# Patient Record
Sex: Female | Born: 1995
Health system: Southern US, Community
[De-identification: ages and names within clinical notes are randomized; demographics above are authoritative.]

## PROBLEM LIST (undated history)

## (undated) DIAGNOSIS — F329 Major depressive disorder, single episode, unspecified: Secondary | ICD-10-CM

## (undated) DIAGNOSIS — F32A Depression, unspecified: Secondary | ICD-10-CM

## (undated) HISTORY — DX: Depression, unspecified: F32.A

## (undated) HISTORY — DX: Major depressive disorder, single episode, unspecified: F32.9

## (undated) HISTORY — PX: NO PAST SURGERIES: SHX2092

---

## 2013-06-30 ENCOUNTER — Ambulatory Visit (INDEPENDENT_AMBULATORY_CARE_PROVIDER_SITE_OTHER): Payer: BC Managed Care – PPO | Admitting: Family

## 2013-06-30 ENCOUNTER — Encounter: Payer: Self-pay | Admitting: Family

## 2013-06-30 VITALS — BP 109/61 | Wt 147.0 lb

## 2013-06-30 DIAGNOSIS — Z3400 Encounter for supervision of normal first pregnancy, unspecified trimester: Secondary | ICD-10-CM

## 2013-06-30 DIAGNOSIS — Z3491 Encounter for supervision of normal pregnancy, unspecified, first trimester: Secondary | ICD-10-CM

## 2013-06-30 LAB — HIV ANTIBODY (ROUTINE TESTING W REFLEX): HIV: NONREACTIVE

## 2013-06-30 NOTE — Progress Notes (Signed)
P - [phone] 

## 2013-06-30 NOTE — Progress Notes (Signed)
New OB visit; reviewed practice information and OB visit schedule.  Uncertain LMP, will return in one week for ultrasound with Lora.  Obtain new ob labs today.  +wet prep due to positive odor.   Exam   BP 109/61  Wt 147 lb (66.679 kg)  LMP 05/06/2013 Uterine Size: 5-6 weeks  Pelvic Exam:    Perineum: No Hemorrhoids, Normal Perineum   Vulva: normal   Vagina:  normal mucosa, +white adherent discharge, +odor, "fishy"   pH: Not done   Cervix:  no cervical motion tenderness and no lesions   Adnexa: normal adnexa and no mass, fullness, tenderness   Bony Pelvis: Adequate  System: Breast:  No nipple retraction or dimpling, inverted nipples bilat; No nipple discharge or bleeding, No axillary or supraclavicular adenopathy, Normal to palpation without dominant masses   Skin: normal coloration and turgor, no rashes    Neurologic: negative   Extremities: normal strength, tone, and muscle mass   HEENT neck supple with midline trachea and thyroid without masses   Mouth/Teeth mucous membranes moist, pharynx normal without lesions   Neck supple and no masses   Cardiovascular: regular rate and rhythm, no murmurs or gallops   Respiratory:  appears well, vitals normal, no respiratory distress, acyanotic, normal RR, neck free of mass or lymphadenopathy, chest clear, no wheezing, crepitations, rhonchi, normal symmetric air entry   Abdomen: soft, non-tender; bowel sounds normal; no masses,  no organomegaly   Urinary: urethral meatus normal

## 2013-07-01 LAB — WET PREP FOR TRICH, YEAST, CLUE

## 2013-07-01 LAB — CYSTIC FIBROSIS DIAGNOSTIC STUDY

## 2013-07-01 LAB — GC/CHLAMYDIA PROBE AMP: CT Probe RNA: NEGATIVE

## 2013-07-02 ENCOUNTER — Other Ambulatory Visit: Payer: Self-pay | Admitting: Family

## 2013-07-02 LAB — OBSTETRIC PANEL
Antibody Screen: NEGATIVE
Basophils Relative: 0 % (ref 0–1)
Eosinophils Relative: 0 % (ref 0–5)
HCT: 36.7 % (ref 36.0–46.0)
Hemoglobin: 12.3 g/dL (ref 12.0–15.0)
MCHC: 33.5 g/dL (ref 30.0–36.0)
MCV: 78.9 fL (ref 78.0–100.0)
Monocytes Absolute: 0.7 10*3/uL (ref 0.1–1.0)
Monocytes Relative: 10 % (ref 3–12)
Neutro Abs: 5.2 10*3/uL (ref 1.7–7.7)
RDW: 14.6 % (ref 11.5–15.5)
Rh Type: POSITIVE
Rubella: 1.02 Index — ABNORMAL HIGH (ref <0.90)

## 2013-07-02 LAB — CULTURE, OB URINE: Organism ID, Bacteria: NO GROWTH

## 2013-07-02 MED ORDER — METRONIDAZOLE 500 MG PO TABS: 500.0000 mg | ORAL_TABLET | Freq: Two times a day (BID) | ORAL | Status: DC

## 2013-07-02 NOTE — Progress Notes (Signed)
Pt notified regarding +clue cells; will pick up RX from pharmacy.

## 2013-07-07 ENCOUNTER — Ambulatory Visit (INDEPENDENT_AMBULATORY_CARE_PROVIDER_SITE_OTHER): Payer: BC Managed Care – PPO | Admitting: Advanced Practice Midwife

## 2013-07-07 VITALS — Ht 65.0 in | Wt 147.0 lb

## 2013-07-07 DIAGNOSIS — Z3480 Encounter for supervision of other normal pregnancy, unspecified trimester: Secondary | ICD-10-CM

## 2013-07-07 DIAGNOSIS — Z3490 Encounter for supervision of normal pregnancy, unspecified, unspecified trimester: Secondary | ICD-10-CM

## 2013-07-07 NOTE — Progress Notes (Signed)
U/S only today  IUP with CRL of 21.21mm and GA of [redacted]w[redacted]d  FHT is 180 BPM and a 29x34 mm possible fibroid noted.  Pt to return in 3 weeks for ROB and discuss whether she wants 1st Trimester screen

## 2013-07-28 ENCOUNTER — Encounter: Payer: Self-pay | Admitting: Family

## 2013-07-28 ENCOUNTER — Ambulatory Visit (INDEPENDENT_AMBULATORY_CARE_PROVIDER_SITE_OTHER): Payer: BC Managed Care – PPO | Admitting: Family

## 2013-07-28 VITALS — BP 119/74 | Temp 97.8°F | Wt 149.0 lb

## 2013-07-28 DIAGNOSIS — Z3490 Encounter for supervision of normal pregnancy, unspecified, unspecified trimester: Secondary | ICD-10-CM

## 2013-07-28 DIAGNOSIS — Z3400 Encounter for supervision of normal first pregnancy, unspecified trimester: Secondary | ICD-10-CM

## 2013-07-28 MED ORDER — ONDANSETRON 8 MG PO TBDP: 8.0000 mg | ORAL_TABLET | Freq: Three times a day (TID) | ORAL | Status: DC | PRN

## 2013-07-28 NOTE — Progress Notes (Signed)
P-117 

## 2013-07-28 NOTE — Progress Notes (Signed)
Ketones trace.  Urine was clear

## 2013-07-28 NOTE — Progress Notes (Signed)
Reports increased nausea/vomiting; denies fever, body aches, or chills. RX Zofran sent to pharmacy.  Trace ketones, clear urine.  Reviewed genetic testing, will do first screen.

## 2013-08-04 ENCOUNTER — Other Ambulatory Visit: Payer: Self-pay

## 2013-08-04 ENCOUNTER — Ambulatory Visit (HOSPITAL_COMMUNITY)
Admission: RE | Admit: 2013-08-04 | Discharge: 2013-08-04 | Disposition: A | Discharge status: Home / Self Care | Payer: BC Managed Care – PPO | Source: Ambulatory Visit | Attending: Family | Admitting: Family

## 2013-08-04 DIAGNOSIS — O3510X0 Maternal care for (suspected) chromosomal abnormality in fetus, unspecified, not applicable or unspecified: Secondary | ICD-10-CM | POA: Insufficient documentation

## 2013-08-04 DIAGNOSIS — O351XX0 Maternal care for (suspected) chromosomal abnormality in fetus, not applicable or unspecified: Secondary | ICD-10-CM | POA: Insufficient documentation

## 2013-08-04 DIAGNOSIS — Z3689 Encounter for other specified antenatal screening: Secondary | ICD-10-CM | POA: Insufficient documentation

## 2013-08-04 DIAGNOSIS — Z3490 Encounter for supervision of normal pregnancy, unspecified, unspecified trimester: Secondary | ICD-10-CM

## 2013-08-10 ENCOUNTER — Encounter: Payer: Self-pay | Admitting: Family

## 2013-08-26 ENCOUNTER — Ambulatory Visit (INDEPENDENT_AMBULATORY_CARE_PROVIDER_SITE_OTHER): Payer: BC Managed Care – PPO | Admitting: Obstetrics & Gynecology

## 2013-08-26 VITALS — BP 121/73 | Wt 154.0 lb

## 2013-08-26 DIAGNOSIS — Z3400 Encounter for supervision of normal first pregnancy, unspecified trimester: Secondary | ICD-10-CM

## 2013-08-26 DIAGNOSIS — Z3402 Encounter for supervision of normal first pregnancy, second trimester: Secondary | ICD-10-CM

## 2013-08-26 NOTE — Progress Notes (Signed)
Pt offered AFP.  Pt not decided today.  Test is ordered and pt knows that the last date the test can be drawn is at 20 weeks 6 days.  Anatomy ordered for 19 weeks.   Pt having some nausea but getting better.  5 lb weight gain in past month.

## 2013-08-26 NOTE — Progress Notes (Signed)
p-99 

## 2013-08-28 ENCOUNTER — Other Ambulatory Visit: Payer: Self-pay

## 2013-09-16 ENCOUNTER — Other Ambulatory Visit (HOSPITAL_COMMUNITY): Payer: Self-pay | Admitting: Obstetrics & Gynecology

## 2013-09-16 ENCOUNTER — Other Ambulatory Visit: Payer: Self-pay | Admitting: Obstetrics & Gynecology

## 2013-09-16 ENCOUNTER — Ambulatory Visit (HOSPITAL_COMMUNITY): Admission: RE | Admit: 2013-09-16 | Payer: BC Managed Care – PPO | Source: Ambulatory Visit

## 2013-09-16 ENCOUNTER — Ambulatory Visit (HOSPITAL_COMMUNITY)
Admission: RE | Admit: 2013-09-16 | Discharge: 2013-09-16 | Disposition: A | Discharge status: Home / Self Care | Payer: BC Managed Care – PPO | Source: Ambulatory Visit | Attending: Obstetrics & Gynecology | Admitting: Obstetrics & Gynecology

## 2013-09-16 DIAGNOSIS — Z3402 Encounter for supervision of normal first pregnancy, second trimester: Secondary | ICD-10-CM

## 2013-09-16 DIAGNOSIS — Z3689 Encounter for other specified antenatal screening: Secondary | ICD-10-CM | POA: Insufficient documentation

## 2013-09-23 ENCOUNTER — Encounter: Payer: BC Managed Care – PPO | Admitting: Obstetrics & Gynecology

## 2013-10-06 ENCOUNTER — Ambulatory Visit (INDEPENDENT_AMBULATORY_CARE_PROVIDER_SITE_OTHER): Payer: BC Managed Care – PPO | Admitting: Advanced Practice Midwife

## 2013-10-06 ENCOUNTER — Encounter: Payer: Self-pay | Admitting: Advanced Practice Midwife

## 2013-10-06 VITALS — BP 131/69 | Wt 161.0 lb

## 2013-10-06 DIAGNOSIS — IMO0002 Reserved for concepts with insufficient information to code with codable children: Secondary | ICD-10-CM

## 2013-10-06 DIAGNOSIS — Z3402 Encounter for supervision of normal first pregnancy, second trimester: Secondary | ICD-10-CM

## 2013-10-06 DIAGNOSIS — Z3400 Encounter for supervision of normal first pregnancy, unspecified trimester: Secondary | ICD-10-CM

## 2013-10-06 NOTE — Patient Instructions (Signed)
Second Trimester of Pregnancy The second trimester is from week 13 through week 28, months 4 through 6. The second trimester is often a time when you feel your best. Your body has also adjusted to being pregnant, and you begin to feel better physically. Usually, morning sickness has lessened or quit completely, you may have more energy, and you may have an increase in appetite. The second trimester is also a time when the fetus is growing rapidly. At the end of the sixth month, the fetus is about 9 inches long and weighs about 1 pounds. You will likely begin to feel the baby move (quickening) between 18 and 20 weeks of the pregnancy. BODY CHANGES Your body goes through many changes during pregnancy. The changes vary from woman to woman.   Your weight will continue to increase. You will notice your lower abdomen bulging out.  You may begin to get stretch marks on your hips, abdomen, and breasts.  You may develop headaches that can be relieved by medicines approved by your caregiver.  You may urinate more often because the fetus is pressing on your bladder.  You may develop or continue to have heartburn as a result of your pregnancy.  You may develop constipation because certain hormones are causing the muscles that push waste through your intestines to slow down.  You may develop hemorrhoids or swollen, bulging veins (varicose veins).  You may have back pain because of the weight gain and pregnancy hormones relaxing your joints between the bones in your pelvis and as a result of a shift in weight and the muscles that support your balance.  Your breasts will continue to grow and be tender.  Your gums may bleed and may be sensitive to brushing and flossing.  Dark spots or blotches (chloasma, mask of pregnancy) may develop on your face. This will likely fade after the baby is born.  A dark line from your belly button to the pubic area (linea nigra) may appear. This will likely fade after the  baby is born. WHAT TO EXPECT AT YOUR PRENATAL VISITS During a routine prenatal visit:  You will be weighed to make sure you and the fetus are growing normally.  Your blood pressure will be taken.  Your abdomen will be measured to track your baby's growth.  The fetal heartbeat will be listened to.  Any test results from the previous visit will be discussed. Your caregiver may ask you:  How you are feeling.  If you are feeling the baby move.  If you have had any abnormal symptoms, such as leaking fluid, bleeding, severe headaches, or abdominal cramping.  If you have any questions. Other tests that may be performed during your second trimester include:  Blood tests that check for:  Low iron levels (anemia).  Gestational diabetes (between 24 and 28 weeks).  Rh antibodies.  Urine tests to check for infections, diabetes, or protein in the urine.  An ultrasound to confirm the proper growth and development of the baby.  An amniocentesis to check for possible genetic problems.  Fetal screens for spina bifida and Down syndrome. HOME CARE INSTRUCTIONS   Avoid all smoking, herbs, alcohol, and unprescribed drugs. These chemicals affect the formation and growth of the baby.  Follow your caregiver's instructions regarding medicine use. There are medicines that are either safe or unsafe to take during pregnancy.  Exercise only as directed by your caregiver. Experiencing uterine cramps is a good sign to stop exercising.  Continue to eat regular,   healthy meals.  Wear a good support bra for breast tenderness.  Do not use hot tubs, steam rooms, or saunas.  Wear your seat belt at all times when driving.  Avoid raw meat, uncooked cheese, cat litter boxes, and soil used by cats. These carry germs that can cause birth defects in the baby.  Take your prenatal vitamins.  Try taking a stool softener (if your caregiver approves) if you develop constipation. Eat more high-fiber foods,  such as fresh vegetables or fruit and whole grains. Drink plenty of fluids to keep your urine clear or pale yellow.  Take warm sitz baths to soothe any pain or discomfort caused by hemorrhoids. Use hemorrhoid cream if your caregiver approves.  If you develop varicose veins, wear support hose. Elevate your feet for 15 minutes, 3 4 times a day. Limit salt in your diet.  Avoid heavy lifting, wear low heel shoes, and practice good posture.  Rest with your legs elevated if you have leg cramps or low back pain.  Visit your dentist if you have not gone yet during your pregnancy. Use a soft toothbrush to brush your teeth and be gentle when you floss.  A sexual relationship may be continued unless your caregiver directs you otherwise.  Continue to go to all your prenatal visits as directed by your caregiver. SEEK MEDICAL CARE IF:   You have dizziness.  You have mild pelvic cramps, pelvic pressure, or nagging pain in the abdominal area.  You have persistent nausea, vomiting, or diarrhea.  You have a bad smelling vaginal discharge.  You have pain with urination. SEEK IMMEDIATE MEDICAL CARE IF:   You have a fever.  You are leaking fluid from your vagina.  You have spotting or bleeding from your vagina.  You have severe abdominal cramping or pain.  You have rapid weight gain or loss.  You have shortness of breath with chest pain.  You notice sudden or extreme swelling of your face, hands, ankles, feet, or legs.  You have not felt your baby move in over an hour.  You have severe headaches that do not go away with medicine.  You have vision changes. Document Released: 10/03/2001 Document Revised: 06/11/2013 Document Reviewed: 12/10/2012 [employer] Patient Information 2014 [employer].  

## 2013-10-06 NOTE — Progress Notes (Signed)
[place] reviewed.  Placenta posterior/fundal.  Marginal cord insertion. Cervix 2.7cm long.  Will consult Dr Sherrie George about these findings (she signed [place]).  Feels well Denies UCs or bleeding

## 2013-10-06 NOTE — Progress Notes (Signed)
p-93  Quad screen declined

## 2013-10-23 NOTE — L&D Delivery Note (Signed)
Operative Delivery Note At 8:09 AM a viable female was delivered via Vaginal, Vacuum Investment banker, operational(Extractor).  Presentation: vertex; Position: Left,, Occiput,, Anterior; Station: +4.  Verbal consent: obtained from patient.  Risks and benefits discussed in detail.  Risks include, but are not limited to the risks of anesthesia, bleeding, infection, damage to maternal tissues, fetal cephalhematoma.  There is also the risk of inability to effect vaginal delivery of the head, or shoulder dystocia that cannot be resolved by established maneuvers, leading to the need for emergency cesarean section. FHR decels had been going on all evening and had progressively gotten worse with some hyperstimulation prior to my arrival.  VE showed pt. To be complete and + 4.  Pushed x 1 with little movement to affect delivery with FHR in the 70's x 8 minutes.  Kiwi applied and pulled with one contraction with easy descent and delivery.  Cord clamped x 2 and taken to waiting peds. Cord blood and pH obtained.  Placenta delivered spontaneously and intact with 3 VC.  Mild atony resolved with removal of clot from LUS.  800 mcg of Cytotec and IV Pitocin given.  APGAR: 4, 9; weight .   Placenta status: Intact, Spontaneous.   Cord: 3 vessels with the following complications: .  Cord pH: 7.13  Anesthesia: Epidural  Instruments: Kiwi vacuum Episiotomy: None Lacerations: Small labial  Est. Blood Loss (mL): 500  Mom to postpartum.  Baby to Couplet care / Skin to Skin.  Reva Boresanya S Brenan [place] 02/11/2014, 9:18 AM

## 2013-11-03 ENCOUNTER — Ambulatory Visit (INDEPENDENT_AMBULATORY_CARE_PROVIDER_SITE_OTHER): Payer: BC Managed Care – PPO | Admitting: Advanced Practice Midwife

## 2013-11-03 ENCOUNTER — Encounter: Payer: Self-pay | Admitting: Advanced Practice Midwife

## 2013-11-03 VITALS — BP 122/67 | Wt 164.0 lb

## 2013-11-03 DIAGNOSIS — Z3402 Encounter for supervision of normal first pregnancy, second trimester: Secondary | ICD-10-CM

## 2013-11-03 DIAGNOSIS — Z3400 Encounter for supervision of normal first pregnancy, unspecified trimester: Secondary | ICD-10-CM

## 2013-11-03 NOTE — Progress Notes (Signed)
No growth US recommended by Dr. Sherrie Georgeecker for marginal cord insertion. Watch fundal height. Undecided about flu vaccine. R/B discussed at length.

## 2013-11-03 NOTE — Patient Instructions (Signed)
Second Trimester of Pregnancy The second trimester is from week 13 through week 28, months 4 through 6. The second trimester is often a time when you feel your best. Your body has also adjusted to being pregnant, and you begin to feel better physically. Usually, morning sickness has lessened or quit completely, you may have more energy, and you may have an increase in appetite. The second trimester is also a time when the fetus is growing rapidly. At the end of the sixth month, the fetus is about 9 inches long and weighs about 1 pounds. You will likely begin to feel the baby move (quickening) between 18 and 20 weeks of the pregnancy. BODY CHANGES Your body goes through many changes during pregnancy. The changes vary from woman to woman.   Your weight will continue to increase. You will notice your lower abdomen bulging out.  You may begin to get stretch marks on your hips, abdomen, and breasts.  You may develop headaches that can be relieved by medicines approved by your caregiver.  You may urinate more often because the fetus is pressing on your bladder.  You may develop or continue to have heartburn as a result of your pregnancy.  You may develop constipation because certain hormones are causing the muscles that push waste through your intestines to slow down.  You may develop hemorrhoids or swollen, bulging veins (varicose veins).  You may have back pain because of the weight gain and pregnancy hormones relaxing your joints between the bones in your pelvis and as a result of a shift in weight and the muscles that support your balance.  Your breasts will continue to grow and be tender.  Your gums may bleed and may be sensitive to brushing and flossing.  Dark spots or blotches (chloasma, mask of pregnancy) may develop on your face. This will likely fade after the baby is born.  A dark line from your belly button to the pubic area (linea nigra) may appear. This will likely fade after the  baby is born. WHAT TO EXPECT AT YOUR PRENATAL VISITS During a routine prenatal visit:  You will be weighed to make sure you and the fetus are growing normally.  Your blood pressure will be taken.  Your abdomen will be measured to track your baby's growth.  The fetal heartbeat will be listened to.  Any test results from the previous visit will be discussed. Your caregiver may ask you:  How you are feeling.  If you are feeling the baby move.  If you have had any abnormal symptoms, such as leaking fluid, bleeding, severe headaches, or abdominal cramping.  If you have any questions. Other tests that may be performed during your second trimester include:  Blood tests that check for:  Low iron levels (anemia).  Gestational diabetes (between 24 and 28 weeks).  Rh antibodies.  Urine tests to check for infections, diabetes, or protein in the urine.  An ultrasound to confirm the proper growth and development of the baby.  An amniocentesis to check for possible genetic problems.  Fetal screens for spina bifida and Down syndrome. HOME CARE INSTRUCTIONS   Avoid all smoking, herbs, alcohol, and unprescribed drugs. These chemicals affect the formation and growth of the baby.  Follow your caregiver's instructions regarding medicine use. There are medicines that are either safe or unsafe to take during pregnancy.  Exercise only as directed by your caregiver. Experiencing uterine cramps is a good sign to stop exercising.  Continue to eat regular,   healthy meals.  Wear a good support bra for breast tenderness.  Do not use hot tubs, steam rooms, or saunas.  Wear your seat belt at all times when driving.  Avoid raw meat, uncooked cheese, cat litter boxes, and soil used by cats. These carry germs that can cause birth defects in the baby.  Take your prenatal vitamins.  Try taking a stool softener (if your caregiver approves) if you develop constipation. Eat more high-fiber foods,  such as fresh vegetables or fruit and whole grains. Drink plenty of fluids to keep your urine clear or pale yellow.  Take warm sitz baths to soothe any pain or discomfort caused by hemorrhoids. Use hemorrhoid cream if your caregiver approves.  If you develop varicose veins, wear support hose. Elevate your feet for 15 minutes, 3 4 times a day. Limit salt in your diet.  Avoid heavy lifting, wear low heel shoes, and practice good posture.  Rest with your legs elevated if you have leg cramps or low back pain.  Visit your dentist if you have not gone yet during your pregnancy. Use a soft toothbrush to brush your teeth and be gentle when you floss.  A sexual relationship may be continued unless your caregiver directs you otherwise.  Continue to go to all your prenatal visits as directed by your caregiver. SEEK MEDICAL CARE IF:   You have dizziness.  You have mild pelvic cramps, pelvic pressure, or nagging pain in the abdominal area.  You have persistent nausea, vomiting, or diarrhea.  You have a bad smelling vaginal discharge.  You have pain with urination. SEEK IMMEDIATE MEDICAL CARE IF:  Breastfeeding Deciding to breastfeed is one of the best choices you can make for you and your baby. A change in hormones during pregnancy causes your breast tissue to grow and increases the number and size of your milk ducts. These hormones also allow proteins, sugars, and fats from your blood supply to make breast milk in your milk-producing glands. Hormones prevent breast milk from being released before your baby is born as well as prompt milk flow after birth. Once breastfeeding has begun, thoughts of your baby, as well as his or her sucking or crying, can stimulate the release of milk from your milk-producing glands.  BENEFITS OF BREASTFEEDING For Your Baby  Your first milk (colostrum) helps your baby's digestive system function better.   There are antibodies in your milk that help your baby fight  off infections.   Your baby has a lower incidence of asthma, allergies, and sudden infant death syndrome.   The nutrients in breast milk are better for your baby than infant formulas and are designed uniquely for your baby's needs.   Breast milk improves your baby's brain development.   Your baby is less likely to develop other conditions, such as childhood obesity, asthma, or type 2 diabetes mellitus.  For You   Breastfeeding helps to create a very special bond between you and your baby.   Breastfeeding is convenient. Breast milk is always available at the correct temperature and costs nothing.   Breastfeeding helps to burn calories and helps you lose the weight gained during pregnancy.   Breastfeeding makes your uterus contract to its prepregnancy size faster and slows bleeding (lochia) after you give birth.   Breastfeeding helps to lower your risk of developing type 2 diabetes mellitus, osteoporosis, and breast or ovarian cancer later in life. SIGNS THAT YOUR BABY IS HUNGRY Early Signs of Hunger  Increased alertness or activity.  Stretching.  Movement of the head from side to side.  Movement of the head and opening of the mouth when the corner of the mouth or cheek is stroked (rooting).  Increased sucking sounds, smacking lips, cooing, sighing, or squeaking.  Hand-to-mouth movements.  Increased sucking of fingers or hands. Late Signs of Hunger  Fussing.  Intermittent crying. Extreme Signs of Hunger Signs of extreme hunger will require calming and consoling before your baby will be able to breastfeed successfully. Do not wait for the following signs of extreme hunger to occur before you initiate breastfeeding:   Restlessness.  A loud, strong cry.   Screaming. BREASTFEEDING BASICS Breastfeeding Initiation  Find a comfortable place to sit or lie down, with your neck and back well supported.  Place a pillow or rolled up blanket under your baby to bring  him or her to the level of your breast (if you are seated). Nursing pillows are specially designed to help support your arms and your baby while you breastfeed.  Make sure that your baby's abdomen is facing your abdomen.   Gently massage your breast. With your fingertips, massage from your chest wall toward your nipple in a circular motion. This encourages milk flow. You may need to continue this action during the feeding if your milk flows slowly.  Support your breast with 4 fingers underneath and your thumb above your nipple. Make sure your fingers are well away from your nipple and your baby's mouth.   Stroke your baby's lips gently with your finger or nipple.   When your baby's mouth is open wide enough, quickly bring your baby to your breast, placing your entire nipple and as much of the colored area around your nipple (areola) as possible into your baby's mouth.   More areola should be visible above your baby's upper lip than below the lower lip.   Your baby's tongue should be between his or her lower gum and your breast.   Ensure that your baby's mouth is correctly positioned around your nipple (latched). Your baby's lips should create a seal on your breast and be turned out (everted).  It is common for your baby to suck about 2 3 minutes in order to start the flow of breast milk. Latching Teaching your baby how to latch on to your breast properly is very important. An improper latch can cause nipple pain and decreased milk supply for you and poor weight gain in your baby. Also, if your baby is not latched onto your nipple properly, he or she may swallow some air during feeding. This can make your baby fussy. Burping your baby when you switch breasts during the feeding can help to get rid of the air. However, teaching your baby to latch on properly is still the best way to prevent fussiness from swallowing air while breastfeeding. Signs that your baby has successfully latched on to  your nipple:    Silent tugging or silent sucking, without causing you pain.   Swallowing heard between every 3 4 sucks.    Muscle movement above and in front of his or her ears while sucking.  Signs that your baby has not successfully latched on to nipple:   Sucking sounds or smacking sounds from your baby while breastfeeding.  Nipple pain. If you think your baby has not latched on correctly, slip your finger into the corner of your baby's mouth to break the suction and place it between your baby's gums. Attempt breastfeeding initiation again. Signs of Successful  Breastfeeding Signs from your baby:   A gradual decrease in the number of sucks or complete cessation of sucking.   Falling asleep.   Relaxation of his or her body.   Retention of a small amount of milk in his or her mouth.   Letting go of your breast by himself or herself. Signs from you:  Breasts that have increased in firmness, weight, and size 1 3 hours after feeding.   Breasts that are softer immediately after breastfeeding.  Increased milk volume, as well as a change in milk consistency and color by the 5th day of breastfeeding.   Nipples that are not sore, cracked, or bleeding. Signs That Your Pecola LeisureBaby is Getting Enough Milk  Wetting at least 3 diapers in a 24-hour period. The urine should be clear and pale yellow by age 675 days.  At least 3 stools in a 24-hour period by age 675 days. The stool should be soft and yellow.  At least 3 stools in a 24-hour period by age 67 days. The stool should be seedy and yellow.  No loss of weight greater than 10% of birth weight during the first 123 days of age.  Average weight gain of 4 7 ounces (120 210 mL) per week after age 74 days.  Consistent daily weight gain by age 675 days, without weight loss after the age of 2 weeks. After a feeding, your baby may spit up a small amount. This is common. BREASTFEEDING FREQUENCY AND DURATION Frequent feeding will help you make  more milk and can prevent sore nipples and breast engorgement. Breastfeed when you feel the need to reduce the fullness of your breasts or when your baby shows signs of hunger. This is called "breastfeeding on demand." Avoid introducing a pacifier to your baby while you are working to establish breastfeeding (the first 4 6 weeks after your baby is born). After this time you may choose to use a pacifier. Research has shown that pacifier use during the first year of a baby's life decreases the risk of sudden infant death syndrome (SIDS). Allow your baby to feed on each breast as long as he or she wants. Breastfeed until your baby is finished feeding. When your baby unlatches or falls asleep while feeding from the first breast, offer the second breast. Because newborns are often sleepy in the first few weeks of life, you may need to awaken your baby to get him or her to feed. Breastfeeding times will vary from baby to baby. However, the following rules can serve as a guide to help you ensure that your baby is properly fed:  Newborns (babies 674 weeks of age or younger) may breastfeed every 1 3 hours.  Newborns should not go longer than 3 hours during the day or 5 hours during the night without breastfeeding.  You should breastfeed your baby a minimum of 8 times in a 24-hour period until you begin to introduce solid foods to your baby at around 326 months of age. BREAST MILK PUMPING Pumping and storing breast milk allows you to ensure that your baby is exclusively fed your breast milk, even at times when you are unable to breastfeed. This is especially important if you are going back to work while you are still breastfeeding or when you are not able to be present during feedings. Your lactation consultant can give you guidelines on how long it is safe to store breast milk.  A breast pump is a machine that allows you to pump  milk from your breast into a sterile bottle. The pumped breast milk can then be stored in  a refrigerator or freezer. Some breast pumps are operated by hand, while others use electricity. Ask your lactation consultant which type will work best for you. Breast pumps can be purchased, but some hospitals and breastfeeding support groups lease breast pumps on a monthly basis. A lactation consultant can teach you how to hand express breast milk, if you prefer not to use a pump.  CARING FOR YOUR BREASTS WHILE YOU BREASTFEED Nipples can become dry, cracked, and sore while breastfeeding. The following recommendations can help keep your breasts moisturized and healthy:  Avoid using soap on your nipples.   Wear a supportive bra. Although not required, special nursing bras and tank tops are designed to allow access to your breasts for breastfeeding without taking off your entire bra or top. Avoid wearing underwire style bras or extremely tight bras.  Air dry your nipples for 3 after each feeding.   Use only cotton bra pads to absorb leaked breast milk. Leaking of breast milk between feedings is normal.   Use lanolin on your nipples after breastfeeding. Lanolin helps to maintain your skin's normal moisture barrier. If you use pure lanolin you do not need to wash it off before feeding your baby again. Pure lanolin is not toxic to your baby. You may also hand express a few drops of breast milk and gently massage that milk into your nipples and allow the milk to air dry. In the first few weeks after giving birth, some women experience extremely full breasts (engorgement). Engorgement can make your breasts feel heavy, warm, and tender to the touch. Engorgement peaks within 3 5 days after you give birth. The following recommendations can help ease engorgement:  Completely empty your breasts while breastfeeding or pumping. You may want to start by applying warm, moist heat (in the shower or with warm water-soaked hand towels) just before feeding or pumping. This increases circulation and helps  the milk flow. If your baby does not completely empty your breasts while breastfeeding, pump any extra milk after he or she is finished.  Wear a snug bra (nursing or regular) or tank top for 1 2 days to signal your body to slightly decrease milk production.  Apply ice packs to your breasts, unless this is too uncomfortable for you.  Make sure that your baby is latched on and positioned properly while breastfeeding. If engorgement persists after 48 hours of following these recommendations, contact your health care provider or a Advertising copywriter. OVERALL HEALTH CARE RECOMMENDATIONS WHILE BREASTFEEDING  Eat healthy foods. Alternate between meals and snacks, eating 3 of each per day. Because what you eat affects your breast milk, some of the foods may make your baby more irritable than usual. Avoid eating these foods if you are sure that they are negatively affecting your baby.  Drink milk, fruit juice, and water to satisfy your thirst (about 10 glasses a day).   Rest often, relax, and continue to take your prenatal vitamins to prevent fatigue, stress, and anemia.  Continue breast self-awareness checks.  Avoid chewing and smoking tobacco.  Avoid alcohol and drug use. Some medicines that may be harmful to your baby can pass through breast milk. It is important to ask your health care provider before taking any medicine, including all over-the-counter and prescription medicine as well as vitamin and herbal supplements. It is possible to become pregnant while breastfeeding. If birth control is desired,  ask your health care provider about options that will be safe for your baby. SEEK MEDICAL CARE IF:   You feel like you want to stop breastfeeding or have become frustrated with breastfeeding.  You have painful breasts or nipples.  Your nipples are cracked or bleeding.  Your breasts are red, tender, or warm.  You have a swollen area on either breast.  You have a fever or chills.  You  have nausea or vomiting.  You have drainage other than breast milk from your nipples.  Your breasts do not become full before feedings by the 5th day after you give birth.  You feel sad and depressed.  Your baby is too sleepy to eat well.  Your baby is having trouble sleeping.   Your baby is wetting less than 3 diapers in a 24-hour period.  Your baby has less than 3 stools in a 24-hour period.  Your baby's skin or the white part of his or her eyes becomes yellow.   Your baby is not gaining weight by 35 days of age. SEEK IMMEDIATE MEDICAL CARE IF:   Your baby is overly tired (lethargic) and does not want to wake up and feed.  Your baby develops an unexplained fever. Document Released: 10/09/2005 Document Revised: 06/11/2013 Document Reviewed: 04/02/2013 [facility] Patient Information 2014 [place], [place].   You have a fever.  You are leaking fluid from your vagina.  You have spotting or bleeding from your vagina.  You have severe abdominal cramping or pain.  You have rapid weight gain or loss.  You have shortness of breath with chest pain.  You notice sudden or extreme swelling of your face, hands, ankles, feet, or legs.  You have not felt your baby move in over an hour.  You have severe headaches that do not go away with medicine.  You have vision changes. Document Released: 10/03/2001 Document Revised: 06/11/2013 Document Reviewed: 12/10/2012 [facility] Patient Information 2014 [place], [place].

## 2013-11-03 NOTE — Progress Notes (Signed)
[id number]  Will do 28 week labs in 3 weeks

## 2013-11-24 ENCOUNTER — Ambulatory Visit (INDEPENDENT_AMBULATORY_CARE_PROVIDER_SITE_OTHER): Payer: BC Managed Care – PPO | Admitting: Advanced Practice Midwife

## 2013-11-24 VITALS — BP 112/69 | Wt 169.0 lb

## 2013-11-24 DIAGNOSIS — IMO0002 Reserved for concepts with insufficient information to code with codable children: Secondary | ICD-10-CM

## 2013-11-24 DIAGNOSIS — Z3480 Encounter for supervision of other normal pregnancy, unspecified trimester: Secondary | ICD-10-CM

## 2013-11-24 DIAGNOSIS — Z3400 Encounter for supervision of normal first pregnancy, unspecified trimester: Secondary | ICD-10-CM

## 2013-11-24 LAB — CBC
HCT: 33.2 % — ABNORMAL LOW (ref 36.0–46.0)
Hemoglobin: 11.5 g/dL — ABNORMAL LOW (ref 12.0–15.0)
MCH: 29.5 pg (ref 26.0–34.0)
MCHC: 34.6 g/dL (ref 30.0–36.0)
MCV: 85.1 fL (ref 78.0–100.0)
PLATELETS: 164 10*3/uL (ref 150–400)
RBC: 3.9 MIL/uL (ref 3.87–5.11)
RDW: 13.2 % (ref 11.5–15.5)
WBC: 6.6 10*3/uL (ref 4.0–10.5)

## 2013-11-24 NOTE — Patient Instructions (Signed)

## 2013-11-24 NOTE — Progress Notes (Signed)
28 weeks labs

## 2013-11-24 NOTE — Progress Notes (Signed)
28 week labs 

## 2013-11-24 NOTE — Progress Notes (Signed)
[id number] 

## 2013-11-24 NOTE — Patient Instructions (Signed)
Tetanus, Diphtheria, Pertussis (Tdap) Vaccine What You Need to Know WHY GET VACCINATED? Tetanus, diphtheria and pertussis can be very serious diseases, even for adolescents and adults. Tdap vaccine can protect us from these diseases. TETANUS (Lockjaw) causes painful muscle tightening and stiffness, usually all over the body.  It can lead to tightening of muscles in the head and neck so you can't open your mouth, swallow, or sometimes even breathe. Tetanus kills about 1 out of 5 people who are infected. DIPHTHERIA can cause a thick coating to form in the back of the throat.  It can lead to breathing problems, paralysis, heart failure, and death. PERTUSSIS (Whooping Cough) causes severe coughing spells, which can cause difficulty breathing, vomiting and disturbed sleep.  It can also lead to weight loss, incontinence, and rib fractures. Up to 2 in 100 adolescents and 5 in 100 adults with pertussis are hospitalized or have complications, which could include pneumonia and death. These diseases are caused by bacteria. Diphtheria and pertussis are spread from person to person through coughing or sneezing. Tetanus enters the body through cuts, scratches, or wounds. Before vaccines, the [place] saw as many as 200,000 cases a year of diphtheria and pertussis, and hundreds of cases of tetanus. Since vaccination began, tetanus and diphtheria have dropped by about 99% and pertussis by about 80%. TDAP VACCINE Tdap vaccine can protect adolescents and adults from tetanus, diphtheria, and pertussis. One dose of Tdap is routinely given at age 11 or 12. People who did not get Tdap at that age should get it as soon as possible. Tdap is especially important for health care professionals and anyone having close contact with a baby younger than 12 months. Pregnant women should get a dose of Tdap during every pregnancy, to protect the newborn from pertussis. Infants are most at risk for severe, life-threatening  complications from pertussis. A similar vaccine, called Td, protects from tetanus and diphtheria, but not pertussis. A Td booster should be given every 10 years. Tdap may be given as one of these boosters if you have not already gotten a dose. Tdap may also be given after a severe cut or burn to prevent tetanus infection. Your doctor can give you more information. Tdap may safely be given at the same time as other vaccines. SOME PEOPLE SHOULD NOT GET THIS VACCINE  If you ever had a life-threatening allergic reaction after a dose of any tetanus, diphtheria, or pertussis containing vaccine, OR if you have a severe allergy to any part of this vaccine, you should not get Tdap. Tell your doctor if you have any severe allergies.  If you had a coma, or long or multiple seizures within 7 days after a childhood dose of DTP or DTaP, you should not get Tdap, unless a cause other than the vaccine was found. You can still get Td.  Talk to your doctor if you:  have epilepsy or another nervous system problem,  had severe pain or swelling after any vaccine containing diphtheria, tetanus or pertussis,  ever had Guillain-Barr Syndrome (GBS),  aren't feeling well on the day the shot is scheduled. RISKS OF A VACCINE REACTION With any medicine, including vaccines, there is a chance of side effects. These are usually mild and go away on their own, but serious reactions are also possible. Brief fainting spells can follow a vaccination, leading to injuries from falling. Sitting or lying down for about 15 minutes can help prevent these. Tell your doctor if you feel dizzy or light-headed, or   have vision changes or ringing in the ears. Mild problems following Tdap (Did not interfere with activities)  Pain where the shot was given (about 3 in 4 adolescents or 2 in 3 adults)  Redness or swelling where the shot was given (about 1 person in 5)  Mild fever of at least 100.56F (up to about 1 in 25 adolescents or 1 in  100 adults)  Headache (about 3 or 4 people in 10)  Tiredness (about 1 person in 3 or 4)  Nausea, vomiting, diarrhea, stomach ache (up to 1 in 4 adolescents or 1 in 10 adults)  Chills, body aches, sore joints, rash, swollen glands (uncommon) Moderate problems following Tdap (Interfered with activities, but did not require medical attention)  Pain where the shot was given (about 1 in 5 adolescents or 1 in 100 adults)  Redness or swelling where the shot was given (up to about 1 in 16 adolescents or 1 in 25 adults)  Fever over 102F (about 1 in 100 adolescents or 1 in 250 adults)  Headache (about 3 in 20 adolescents or 1 in 10 adults)  Nausea, vomiting, diarrhea, stomach ache (up to 1 or 3 people in 100)  Swelling of the entire arm where the shot was given (up to about 3 in 100). Severe problems following Tdap (Unable to perform usual activities, required medical attention)  Swelling, severe pain, bleeding and redness in the arm where the shot was given (rare). A severe allergic reaction could occur after any vaccine (estimated less than 1 in a million doses). WHAT IF THERE IS A SERIOUS REACTION? What should I look for?  Look for anything that concerns you, such as signs of a severe allergic reaction, very high fever, or behavior changes. Signs of a severe allergic reaction can include hives, swelling of the face and throat, difficulty breathing, a fast heartbeat, dizziness, and weakness. These would start a few minutes to a few hours after the vaccination. What should I do?  If you think it is a severe allergic reaction or other emergency that can't wait, call 9-1-1 or get the person to the nearest hospital. Otherwise, call your doctor.  Afterward, the reaction should be reported to the "Vaccine Adverse Event Reporting System" (VAERS). Your doctor might file this report, or you can do it yourself through the VAERS web site at www.vaers.LAgents.nohhs.gov, or by calling 1-(718)235-1523. VAERS is  only for reporting reactions. They do not give medical advice.  THE NATIONAL VACCINE INJURY COMPENSATION PROGRAM The National Vaccine Injury Compensation Program (VICP) is a federal program that was created to compensate people who may have been injured by certain vaccines. Persons who believe they may have been injured by a vaccine can learn about the program and about filing a claim by calling 1-(670)676-5075 or visiting the VICP website at SpiritualWord.atwww.hrsa.gov/vaccinecompensation. HOW CAN I LEARN MORE?  Ask your doctor.  Call your local or state health department.  Contact the Centers for Disease Control and Prevention (CDC):  Call 646-183-72521-302-056-6381 or visit CDC's website at PicCapture.uywww.cdc.gov/vaccines. CDC Tdap Vaccine VIS (02/29/12) Document Released: 04/09/2012 Document Revised: 02/03/2013 Document Reviewed: 01/29/2013 [facility] Patient Information 2014 [place], [place].  Preterm Labor Information Preterm labor is when labor starts at less than 37 weeks of pregnancy. The normal length of a pregnancy is 39 to 41 weeks. CAUSES Often, there is no identifiable underlying cause as to why a woman goes into preterm labor. One of the most common known causes of preterm labor is infection. Infections of the uterus,  cervix, vagina, amniotic sac, bladder, kidney, or even the lungs (pneumonia) can cause labor to start. Other suspected causes of preterm labor include:   Urogenital infections, such as yeast infections and bacterial vaginosis.   Uterine abnormalities (uterine shape, uterine septum, fibroids, or bleeding from the placenta).   A cervix that has been operated on (it may fail to stay closed).   Malformations in the fetus.   Multiple gestations (twins, triplets, and so on).   Breakage of the amniotic sac.  RISK FACTORS  Having a previous history of preterm labor.   Having premature rupture of membranes (PROM).   Having a placenta that covers the opening of the cervix (placenta previa).    Having a placenta that separates from the uterus (placental abruption).   Having a cervix that is too weak to hold the fetus in the uterus (incompetent cervix).   Having too much fluid in the amniotic sac (polyhydramnios).   Taking illegal drugs or smoking while pregnant.   Not gaining enough weight while pregnant.   Being younger than 108 and older than 20 years old.   Having a low socioeconomic status.   Being African American. SYMPTOMS Signs and symptoms of preterm labor include:   Menstrual-like cramps, abdominal pain, or back pain.  Uterine contractions that are regular, as frequent as six in an hour, regardless of their intensity (may be mild or painful).  Contractions that start on the top of the uterus and spread down to the lower abdomen and back.   A sense of increased pelvic pressure.   A watery or bloody mucus discharge that comes from the vagina.  TREATMENT Depending on the length of the pregnancy and other circumstances, your health care provider may suggest bed rest. If necessary, there are medicines that can be given to stop contractions and to mature the fetal lungs. If labor happens before 34 weeks of pregnancy, a prolonged hospital stay may be recommended. Treatment depends on the condition of both you and the fetus.  WHAT SHOULD YOU DO IF YOU THINK YOU ARE IN PRETERM LABOR? Call your health care provider right away. You will need to go to the hospital to get checked immediately. HOW CAN YOU PREVENT PRETERM LABOR IN FUTURE PREGNANCIES? You should:   Stop smoking if you smoke.  Maintain healthy weight gain and avoid chemicals and drugs that are not necessary.  Be watchful for any type of infection.  Inform your health care provider if you have a known history of preterm labor. Document Released: 12/30/2003 Document Revised: 06/11/2013 Document Reviewed: 11/11/2012 [facility] Patient Information 2014 [place], [place].

## 2013-11-24 NOTE — Progress Notes (Signed)
P=93 

## 2013-11-25 ENCOUNTER — Telehealth: Payer: Self-pay | Admitting: *Deleted

## 2013-11-25 LAB — HIV ANTIBODY (ROUTINE TESTING W REFLEX): HIV: NONREACTIVE

## 2013-11-25 LAB — RPR

## 2013-11-25 LAB — GLUCOSE TOLERANCE, 1 HOUR (50G) W/O FASTING: Glucose, 1 Hour GTT: 72 mg/dL (ref 70–140)

## 2013-11-25 NOTE — Telephone Encounter (Signed)
Pt notified of normal 1 hr GTT. 

## 2013-12-12 ENCOUNTER — Encounter: Payer: Self-pay | Admitting: Advanced Practice Midwife

## 2013-12-12 ENCOUNTER — Ambulatory Visit (INDEPENDENT_AMBULATORY_CARE_PROVIDER_SITE_OTHER): Payer: BC Managed Care – PPO | Admitting: Advanced Practice Midwife

## 2013-12-12 VITALS — BP 111/67 | Wt 172.0 lb

## 2013-12-12 DIAGNOSIS — Z3480 Encounter for supervision of other normal pregnancy, unspecified trimester: Secondary | ICD-10-CM

## 2013-12-12 DIAGNOSIS — IMO0002 Reserved for concepts with insufficient information to code with codable children: Secondary | ICD-10-CM

## 2013-12-12 NOTE — Patient Instructions (Signed)
Third Trimester of Pregnancy  The third trimester is from week 29 through week 42, months 7 through 9. The third trimester is a time when the fetus is growing rapidly. At the end of the ninth month, the fetus is about 20 inches in length and weighs 6 10 pounds.   BODY CHANGES  Your body goes through many changes during pregnancy. The changes vary from woman to woman.    Your weight will continue to increase. You can expect to gain 25 35 pounds (11 16 kg) by the end of the pregnancy.   You may begin to get stretch marks on your hips, abdomen, and breasts.   You may urinate more often because the fetus is moving lower into your pelvis and pressing on your bladder.   You may develop or continue to have heartburn as a result of your pregnancy.   You may develop constipation because certain hormones are causing the muscles that push waste through your intestines to slow down.   You may develop hemorrhoids or swollen, bulging veins (varicose veins).   You may have pelvic pain because of the weight gain and pregnancy hormones relaxing your joints between the bones in your pelvis. Back aches may result from over exertion of the muscles supporting your posture.   Your breasts will continue to grow and be tender. A yellow discharge may leak from your breasts called colostrum.   Your belly button may stick out.   You may feel short of breath because of your expanding uterus.   You may notice the fetus "dropping," or moving lower in your abdomen.   You may have a bloody mucus discharge. This usually occurs a few days to a week before labor begins.   Your cervix becomes thin and soft (effaced) near your due date.  WHAT TO EXPECT AT YOUR PRENATAL EXAMS   You will have prenatal exams every 2 weeks until week 36. Then, you will have weekly prenatal exams. During a routine prenatal visit:   You will be weighed to make sure you and the fetus are growing normally.   Your blood pressure is taken.   Your abdomen will be  measured to track your baby's growth.   The fetal heartbeat will be listened to.   Any test results from the previous visit will be discussed.   You may have a cervical check near your due date to see if you have effaced.  At around 36 weeks, your caregiver will check your cervix. At the same time, your caregiver will also perform a test on the secretions of the vaginal tissue. This test is to determine if a type of bacteria, Group B streptococcus, is present. Your caregiver will explain this further.  Your caregiver may ask you:   What your birth plan is.   How you are feeling.   If you are feeling the baby move.   If you have had any abnormal symptoms, such as leaking fluid, bleeding, severe headaches, or abdominal cramping.   If you have any questions.  Other tests or screenings that may be performed during your third trimester include:   Blood tests that check for low iron levels (anemia).   Fetal testing to check the health, activity level, and growth of the fetus. Testing is done if you have certain medical conditions or if there are problems during the pregnancy.  FALSE LABOR  You may feel small, irregular contractions that eventually go away. These are called Braxton Hicks contractions, or   false labor. Contractions may last for hours, days, or even weeks before true labor sets in. If contractions come at regular intervals, intensify, or become painful, it is best to be seen by your caregiver.   SIGNS OF LABOR    Menstrual-like cramps.   Contractions that are 5 minutes apart or less.   Contractions that start on the top of the uterus and spread down to the lower abdomen and back.   A sense of increased pelvic pressure or back pain.   A watery or bloody mucus discharge that comes from the vagina.  If you have any of these signs before the 37th week of pregnancy, call your caregiver right away. You need to go to the hospital to get checked immediately.  HOME CARE INSTRUCTIONS    Avoid all  smoking, herbs, alcohol, and unprescribed drugs. These chemicals affect the formation and growth of the baby.   Follow your caregiver's instructions regarding medicine use. There are medicines that are either safe or unsafe to take during pregnancy.   Exercise only as directed by your caregiver. Experiencing uterine cramps is a good sign to stop exercising.   Continue to eat regular, healthy meals.   Wear a good support bra for breast tenderness.   Do not use hot tubs, steam rooms, or saunas.   Wear your seat belt at all times when driving.   Avoid raw meat, uncooked cheese, cat litter boxes, and soil used by cats. These carry germs that can cause birth defects in the baby.   Take your prenatal vitamins.   Try taking a stool softener (if your caregiver approves) if you develop constipation. Eat more high-fiber foods, such as fresh vegetables or fruit and whole grains. Drink plenty of fluids to keep your urine clear or pale yellow.   Take warm sitz baths to soothe any pain or discomfort caused by hemorrhoids. Use hemorrhoid cream if your caregiver approves.   If you develop varicose veins, wear support hose. Elevate your feet for 15 minutes, 3 4 times a day. Limit salt in your diet.   Avoid heavy lifting, wear low heal shoes, and practice good posture.   Rest a lot with your legs elevated if you have leg cramps or low back pain.   Visit your dentist if you have not gone during your pregnancy. Use a soft toothbrush to brush your teeth and be gentle when you floss.   A sexual relationship may be continued unless your caregiver directs you otherwise.   Do not travel far distances unless it is absolutely necessary and only with the approval of your caregiver.   Take prenatal classes to understand, practice, and ask questions about the labor and delivery.   Make a trial run to the hospital.   Pack your hospital bag.   Prepare the baby's nursery.   Continue to go to all your prenatal visits as directed  by your caregiver.  SEEK MEDICAL CARE IF:   You are unsure if you are in labor or if your water has broken.   You have dizziness.   You have mild pelvic cramps, pelvic pressure, or nagging pain in your abdominal area.   You have persistent nausea, vomiting, or diarrhea.   You have a bad smelling vaginal discharge.   You have pain with urination.  SEEK IMMEDIATE MEDICAL CARE IF:    You have a fever.   You are leaking fluid from your vagina.   You have spotting or bleeding from your vagina.     You have severe abdominal cramping or pain.   You have rapid weight loss or gain.   You have shortness of breath with chest pain.   You notice sudden or extreme swelling of your face, hands, ankles, feet, or legs.   You have not felt your baby move in over an hour.   You have severe headaches that do not go away with medicine.   You have vision changes.  Document Released: 10/03/2001 Document Revised: 06/11/2013 Document Reviewed: 12/10/2012  [employer] Patient Information 2014 [employer].

## 2013-12-12 NOTE — Progress Notes (Signed)
p-100 

## 2013-12-12 NOTE — Progress Notes (Signed)
Doing well. Reviewed glucola normal. Reviewed things to call/come in for.

## 2013-12-26 ENCOUNTER — Ambulatory Visit (INDEPENDENT_AMBULATORY_CARE_PROVIDER_SITE_OTHER): Payer: BC Managed Care – PPO | Admitting: Advanced Practice Midwife

## 2013-12-26 VITALS — BP 103/62 | Wt 173.0 lb

## 2013-12-26 DIAGNOSIS — Z3400 Encounter for supervision of normal first pregnancy, unspecified trimester: Secondary | ICD-10-CM

## 2013-12-26 NOTE — Progress Notes (Signed)
p-88 

## 2013-12-26 NOTE — Progress Notes (Signed)
P = 87 

## 2013-12-26 NOTE — Progress Notes (Signed)
Doing well.  Good fetal movement, denies vaginal bleeding, LOF, regular contractions.  Fell last week, no contractions or vaginal bleeding afterwards.  Discussed recommendation to come to hospital or office for monitoring if injury during pregnancy.

## 2014-01-12 ENCOUNTER — Ambulatory Visit (INDEPENDENT_AMBULATORY_CARE_PROVIDER_SITE_OTHER): Payer: BC Managed Care – PPO | Admitting: Advanced Practice Midwife

## 2014-01-12 VITALS — BP 119/72 | Wt 171.0 lb

## 2014-01-12 DIAGNOSIS — Z3480 Encounter for supervision of other normal pregnancy, unspecified trimester: Secondary | ICD-10-CM

## 2014-01-12 LAB — OB RESULTS CONSOLE GBS: GBS: POSITIVE

## 2014-01-12 LAB — OB RESULTS CONSOLE GC/CHLAMYDIA
Chlamydia: NEGATIVE
Gonorrhea: NEGATIVE

## 2014-01-12 NOTE — Progress Notes (Signed)
Doing well.  Good fetal movement, denies vaginal bleeding, LOF, regular contractions. GBS/GCC done today. Desires cervical exam.

## 2014-01-12 NOTE — Progress Notes (Signed)
p=102 

## 2014-01-13 LAB — GC/CHLAMYDIA PROBE AMP
CT Probe RNA: NEGATIVE
GC Probe RNA: NEGATIVE

## 2014-01-14 ENCOUNTER — Encounter: Payer: Self-pay | Admitting: Advanced Practice Midwife

## 2014-01-14 DIAGNOSIS — O99820 Streptococcus B carrier state complicating pregnancy: Secondary | ICD-10-CM | POA: Insufficient documentation

## 2014-01-14 LAB — CULTURE, BETA STREP (GROUP B ONLY)

## 2014-01-19 ENCOUNTER — Ambulatory Visit (INDEPENDENT_AMBULATORY_CARE_PROVIDER_SITE_OTHER): Payer: BC Managed Care – PPO | Admitting: Advanced Practice Midwife

## 2014-01-19 VITALS — BP 116/74 | Wt 175.0 lb

## 2014-01-19 DIAGNOSIS — Z3400 Encounter for supervision of normal first pregnancy, unspecified trimester: Secondary | ICD-10-CM

## 2014-01-19 NOTE — Progress Notes (Signed)
Doing well.  Good fetal movement, denies vaginal bleeding, LOF, regular contractions.  Reviewed GBS positive, need for abx in labor.  Desires cervical exam.

## 2014-01-19 NOTE — Progress Notes (Signed)
P - 97 

## 2014-01-26 ENCOUNTER — Encounter: Payer: Self-pay | Admitting: Family

## 2014-01-26 ENCOUNTER — Ambulatory Visit (INDEPENDENT_AMBULATORY_CARE_PROVIDER_SITE_OTHER): Payer: BC Managed Care – PPO | Admitting: Family

## 2014-01-26 VITALS — BP 112/68 | Wt 175.0 lb

## 2014-01-26 DIAGNOSIS — Z3400 Encounter for supervision of normal first pregnancy, unspecified trimester: Secondary | ICD-10-CM

## 2014-01-26 NOTE — Progress Notes (Signed)
No questions or concerns.  Reviewed signs of labor.  Encouraged to sign up for tour of hospital.

## 2014-01-26 NOTE — Progress Notes (Signed)
p-83       Urine- small blood

## 2014-02-02 ENCOUNTER — Ambulatory Visit (INDEPENDENT_AMBULATORY_CARE_PROVIDER_SITE_OTHER): Payer: BC Managed Care – PPO | Admitting: Advanced Practice Midwife

## 2014-02-02 ENCOUNTER — Encounter: Payer: Self-pay | Admitting: Advanced Practice Midwife

## 2014-02-02 VITALS — BP 111/66 | Wt 175.0 lb

## 2014-02-02 DIAGNOSIS — Z3400 Encounter for supervision of normal first pregnancy, unspecified trimester: Secondary | ICD-10-CM

## 2014-02-02 NOTE — Progress Notes (Signed)
Hospital tour this week. Didn't do CBE or BF classes. Discussed BF today. Watch fundal height.

## 2014-02-02 NOTE — Progress Notes (Signed)
p-81 

## 2014-02-09 ENCOUNTER — Ambulatory Visit (INDEPENDENT_AMBULATORY_CARE_PROVIDER_SITE_OTHER): Payer: BC Managed Care – PPO | Admitting: Advanced Practice Midwife

## 2014-02-09 ENCOUNTER — Encounter (HOSPITAL_COMMUNITY): Payer: Self-pay

## 2014-02-09 ENCOUNTER — Inpatient Hospital Stay (HOSPITAL_COMMUNITY)
Admission: AD | Admit: 2014-02-09 | Discharge: 2014-02-10 | Disposition: A | Discharge status: Home / Self Care | Payer: BC Managed Care – PPO | Source: Ambulatory Visit | Attending: Obstetrics & Gynecology | Admitting: Obstetrics & Gynecology

## 2014-02-09 VITALS — BP 115/71 | Wt 177.0 lb

## 2014-02-09 DIAGNOSIS — O479 False labor, unspecified: Secondary | ICD-10-CM | POA: Insufficient documentation

## 2014-02-09 DIAGNOSIS — Z3400 Encounter for supervision of normal first pregnancy, unspecified trimester: Secondary | ICD-10-CM

## 2014-02-09 DIAGNOSIS — O26849 Uterine size-date discrepancy, unspecified trimester: Secondary | ICD-10-CM

## 2014-02-09 DIAGNOSIS — O26843 Uterine size-date discrepancy, third trimester: Secondary | ICD-10-CM | POA: Insufficient documentation

## 2014-02-09 NOTE — Progress Notes (Signed)
Growth US ordered

## 2014-02-09 NOTE — MAU Note (Signed)
PT SAYS SHE  IS HAVING UC-   WAS  1-2 CM  IN OFFICE IN [place].   DENIES SROM-  HAS  BLOODY SHOW.

## 2014-02-09 NOTE — Progress Notes (Signed)
P - 67 

## 2014-02-10 ENCOUNTER — Ambulatory Visit (HOSPITAL_COMMUNITY): Payer: BC Managed Care – PPO | Attending: Advanced Practice Midwife

## 2014-02-10 ENCOUNTER — Inpatient Hospital Stay (HOSPITAL_COMMUNITY): Payer: BC Managed Care – PPO | Admitting: Anesthesiology

## 2014-02-10 ENCOUNTER — Encounter (HOSPITAL_COMMUNITY): Payer: Self-pay | Admitting: *Deleted

## 2014-02-10 ENCOUNTER — Inpatient Hospital Stay (HOSPITAL_COMMUNITY)
Admission: AD | Admit: 2014-02-10 | Discharge: 2014-02-13 | DRG: 775 | Disposition: A | Discharge status: Home / Self Care | Payer: BC Managed Care – PPO | Source: Ambulatory Visit | Attending: Family Medicine | Admitting: Family Medicine

## 2014-02-10 ENCOUNTER — Encounter (HOSPITAL_COMMUNITY): Payer: BC Managed Care – PPO | Admitting: Anesthesiology

## 2014-02-10 DIAGNOSIS — O480 Post-term pregnancy: Secondary | ICD-10-CM | POA: Diagnosis present

## 2014-02-10 DIAGNOSIS — Z3400 Encounter for supervision of normal first pregnancy, unspecified trimester: Secondary | ICD-10-CM

## 2014-02-10 DIAGNOSIS — Z22330 Carrier of Group B streptococcus: Secondary | ICD-10-CM

## 2014-02-10 DIAGNOSIS — O9989 Other specified diseases and conditions complicating pregnancy, childbirth and the puerperium: Secondary | ICD-10-CM

## 2014-02-10 DIAGNOSIS — IMO0002 Reserved for concepts with insufficient information to code with codable children: Secondary | ICD-10-CM

## 2014-02-10 DIAGNOSIS — IMO0001 Reserved for inherently not codable concepts without codable children: Secondary | ICD-10-CM

## 2014-02-10 DIAGNOSIS — O99820 Streptococcus B carrier state complicating pregnancy: Secondary | ICD-10-CM

## 2014-02-10 DIAGNOSIS — O99892 Other specified diseases and conditions complicating childbirth: Secondary | ICD-10-CM | POA: Diagnosis present

## 2014-02-10 DIAGNOSIS — Z8759 Personal history of other complications of pregnancy, childbirth and the puerperium: Secondary | ICD-10-CM

## 2014-02-10 LAB — CBC
HCT: 39.4 % (ref 36.0–46.0)
HEMOGLOBIN: 13.6 g/dL (ref 12.0–15.0)
MCH: 29.9 pg (ref 26.0–34.0)
MCHC: 34.5 g/dL (ref 30.0–36.0)
MCV: 86.6 fL (ref 78.0–100.0)
PLATELETS: 126 10*3/uL — AB (ref 150–400)
RBC: 4.55 MIL/uL (ref 3.87–5.11)
RDW: 13.2 % (ref 11.5–15.5)
WBC: 17.2 10*3/uL — ABNORMAL HIGH (ref 4.0–10.5)

## 2014-02-10 MED ORDER — PENICILLIN G POTASSIUM 5000000 UNITS IJ SOLR
2.5000 10*6.[IU] | INTRAVENOUS | Status: DC
  Administered 2014-02-11 (×2): 2.5 10*6.[IU] via INTRAVENOUS
  Filled 2014-02-10 (×4): qty 2.5

## 2014-02-10 MED ORDER — OXYCODONE-ACETAMINOPHEN 5-325 MG PO TABS: 1.0000 | ORAL_TABLET | ORAL | Status: DC | PRN

## 2014-02-10 MED ORDER — CITRIC ACID-SODIUM CITRATE 334-500 MG/5ML PO SOLN
30.0000 mL | ORAL | Status: DC | PRN
  Filled 2014-02-10: qty 15

## 2014-02-10 MED ORDER — PENICILLIN G POTASSIUM 5000000 UNITS IJ SOLR
5.0000 10*6.[IU] | Freq: Once | INTRAVENOUS | Status: AC
  Administered 2014-02-10: 5 10*6.[IU] via INTRAVENOUS
  Filled 2014-02-10: qty 5

## 2014-02-10 MED ORDER — PHENYLEPHRINE 40 MCG/ML (10ML) SYRINGE FOR IV PUSH (FOR BLOOD PRESSURE SUPPORT)
80.0000 ug | PREFILLED_SYRINGE | INTRAVENOUS | Status: DC | PRN
  Administered 2014-02-10: 80 ug via INTRAVENOUS
  Filled 2014-02-10 (×3): qty 10

## 2014-02-10 MED ORDER — ONDANSETRON 8 MG/NS 50 ML IVPB
8.0000 mg | Freq: Four times a day (QID) | INTRAVENOUS | Status: DC | PRN
  Administered 2014-02-10: 8 mg via INTRAVENOUS
  Filled 2014-02-10: qty 8

## 2014-02-10 MED ORDER — PHENYLEPHRINE 40 MCG/ML (10ML) SYRINGE FOR IV PUSH (FOR BLOOD PRESSURE SUPPORT)
80.0000 ug | PREFILLED_SYRINGE | INTRAVENOUS | Status: AC | PRN
  Administered 2014-02-10 (×3): 80 ug via INTRAVENOUS

## 2014-02-10 MED ORDER — DIPHENHYDRAMINE HCL 50 MG/ML IJ SOLN: 12.5000 mg | INTRAMUSCULAR | Status: DC | PRN

## 2014-02-10 MED ORDER — OXYTOCIN 40 UNITS IN LACTATED RINGERS INFUSION - SIMPLE MED: 62.5000 mL/h | INTRAVENOUS | Status: DC

## 2014-02-10 MED ORDER — IBUPROFEN 600 MG PO TABS
600.0000 mg | ORAL_TABLET | Freq: Four times a day (QID) | ORAL | Status: DC | PRN
  Administered 2014-02-11: 600 mg via ORAL
  Filled 2014-02-10: qty 1

## 2014-02-10 MED ORDER — LACTATED RINGERS IV SOLN
INTRAVENOUS | Status: DC
  Administered 2014-02-10: 21:00:00 via INTRAVENOUS

## 2014-02-10 MED ORDER — LACTATED RINGERS IV SOLN
500.0000 mL | Freq: Once | INTRAVENOUS | Status: AC
  Administered 2014-02-10: 500 mL via INTRAVENOUS

## 2014-02-10 MED ORDER — LIDOCAINE HCL (PF) 1 % IJ SOLN
30.0000 mL | INTRAMUSCULAR | Status: DC | PRN
  Filled 2014-02-10 (×2): qty 30

## 2014-02-10 MED ORDER — OXYTOCIN BOLUS FROM INFUSION: 500.0000 mL | INTRAVENOUS | Status: DC

## 2014-02-10 MED ORDER — ACETAMINOPHEN 325 MG PO TABS: 650.0000 mg | ORAL_TABLET | ORAL | Status: DC | PRN

## 2014-02-10 MED ORDER — OXYTOCIN 40 UNITS IN LACTATED RINGERS INFUSION - SIMPLE MED
1.0000 m[IU]/min | INTRAVENOUS | Status: DC
  Administered 2014-02-10: 1 m[IU]/min via INTRAVENOUS
  Filled 2014-02-10: qty 1000

## 2014-02-10 MED ORDER — LACTATED RINGERS IV SOLN
INTRAVENOUS | Status: DC
  Administered 2014-02-10: 23:00:00 via INTRAUTERINE

## 2014-02-10 MED ORDER — ONDANSETRON HCL 4 MG/2ML IJ SOLN: 4.0000 mg | Freq: Four times a day (QID) | INTRAMUSCULAR | Status: DC | PRN

## 2014-02-10 MED ORDER — TERBUTALINE SULFATE 1 MG/ML IJ SOLN
INTRAMUSCULAR | Status: AC
Start: 2014-02-10 — End: 2014-02-10
  Administered 2014-02-10: 0.25 mg via SUBCUTANEOUS
  Filled 2014-02-10: qty 1

## 2014-02-10 MED ORDER — EPHEDRINE 5 MG/ML INJ
10.0000 mg | INTRAVENOUS | Status: DC | PRN
  Filled 2014-02-10: qty 4

## 2014-02-10 MED ORDER — EPHEDRINE 5 MG/ML INJ: 10.0000 mg | INTRAVENOUS | Status: DC | PRN

## 2014-02-10 MED ORDER — SODIUM BICARBONATE 8.4 % IV SOLN
INTRAVENOUS | Status: DC | PRN
  Administered 2014-02-10 (×2): 5 mL via EPIDURAL

## 2014-02-10 MED ORDER — LACTATED RINGERS IV SOLN: 500.0000 mL | INTRAVENOUS | Status: DC | PRN

## 2014-02-10 MED ORDER — TERBUTALINE SULFATE 1 MG/ML IJ SOLN
0.2500 mg | Freq: Once | INTRAMUSCULAR | Status: AC | PRN
  Administered 2014-02-10: 0.25 mg via SUBCUTANEOUS

## 2014-02-10 MED ORDER — FENTANYL 2.5 MCG/ML BUPIVACAINE 1/10 % EPIDURAL INFUSION (WH - ANES)
14.0000 mL/h | INTRAMUSCULAR | Status: DC | PRN
  Administered 2014-02-10 – 2014-02-11 (×2): 14 mL/h via EPIDURAL
  Filled 2014-02-10 (×2): qty 125

## 2014-02-10 NOTE — Anesthesia Procedure Notes (Signed)

## 2014-02-10 NOTE — H&P (Signed)
Megan Hammond is a 18 y.o. female presenting for labor evaluation. Maternal Medical History:  Reason for admission: Contractions and nausea.   Contractions: Onset was yesterday.   Frequency: regular.   Duration is approximately 60 seconds.    Fetal activity: Perceived fetal activity is normal.   Last perceived fetal movement was within the past hour.      OB History   Grav Para Term Preterm Abortions TAB SAB Ect Mult Living   1              History reviewed. No pertinent past medical history. History reviewed. No pertinent past surgical history. Family History: family history is not on file. Social History:  reports that she has never smoked. She has never used smokeless tobacco. She reports that she does not drink alcohol. Her drug history is not on file.   Prenatal Transfer Tool  Maternal Diabetes: No Genetic Screening: Declined Maternal Ultrasounds/Referrals: Normal Fetal Ultrasounds or other Referrals:  None Maternal Substance Abuse:  No Significant Maternal Medications:  None Significant Maternal Lab Results:  None Other Comments:  None  Review of Systems  Constitutional: Negative for fever.  Eyes: Negative for blurred vision.  Cardiovascular: Negative for chest pain.  Gastrointestinal: Positive for nausea, vomiting and abdominal pain (contractions ). Negative for diarrhea and constipation.  Genitourinary: Negative for dysuria and urgency.  Musculoskeletal: Negative for myalgias.  Neurological: Negative for dizziness and headaches.    Dilation: 3 Effacement (%): 100 Station: -2 Exam by:: j. Rana SnareLowe RN Blood pressure 118/62, pulse 89, temperature 98.5 F (36.9 C), temperature source Oral, resp. rate 18, height 5\' 4"  (1.626 m), weight 79.379 kg (175 lb), last menstrual period 05/06/2013. Maternal Exam:  Uterine Assessment: Contraction strength is moderate.  Contraction frequency is regular.   Abdomen: Fetal presentation: vertex  Introitus: Normal vulva. Ferning  test: not done.  Nitrazine test: not done.  Pelvis: adequate for delivery.   Cervix: Cervix evaluated by digital exam.     Fetal Exam Fetal Monitor Review: Mode: ultrasound.   Baseline rate: 140.  Variability: moderate (6-25 bpm).   Pattern: accelerations present, variable decelerations and prolonged decelerations.    Fetal State Assessment: Category II - tracings are indeterminate.     Physical Exam  Nursing note and vitals reviewed. Constitutional: She is oriented to person, place, and time. She appears well-developed and well-nourished. No distress.  Cardiovascular: Normal rate.   Respiratory: Effort normal.  GI: Soft. There is no tenderness.  Genitourinary:  Cervix: 2-3/100/-1 AROM and IUPC placed   Neurological: She is alert and oriented to person, place, and time.  Skin: Skin is warm and dry.  Psychiatric: She has a normal mood and affect.    Prenatal labs: ABO, Rh: O/POS/-- (09/08 0955) Antibody: NEG (09/08 0955) Rubella: 1.02 (09/08 0955) RPR: NON REAC (02/02 0909)  HBsAg: NEGATIVE (09/08 0955)  HIV: NON REACTIVE (02/02 0909)  GBS: Positive (03/23 0000)   Assessment/Plan: G1P0 at 2058w0d who presents with early labor FHR with variable decelerations, and one prolonged deceleration AROM with IUPC and amnioinfusion Low dose pitocin Anticipate NSVD   Tawnya CrookHeather Donovan Hogan 02/10/2014, 9:40 PM

## 2014-02-10 NOTE — Discharge Instructions (Signed)
Braxton Hicks Contractions Pregnancy is commonly associated with contractions of the uterus throughout the pregnancy. Towards the end of pregnancy (32 to 34 weeks), these contractions (Braxton Hicks) can develop more often and may become more forceful. This is not true labor because these contractions do not result in opening (dilatation) and thinning of the cervix. They are sometimes difficult to tell apart from true labor because these contractions can be forceful and people have different pain tolerances. You should not feel embarrassed if you go to the hospital with false labor. Sometimes, the only way to tell if you are in true labor is for your caregiver to follow the changes in the cervix. How to tell the difference between true and false labor:  False labor.  The contractions of false labor are usually shorter, irregular and not as hard as those of true labor.  They are often felt in the front of the lower abdomen and in the groin.  They may leave with walking around or changing positions while lying down.  They get weaker and are shorter lasting as time goes on.  These contractions are usually irregular.  They do not usually become progressively stronger, regular and closer together as with true labor.  True labor.  Contractions in true labor last 30 to 70 seconds, become very regular, usually become more intense, and increase in frequency.  They do not go away with walking.  The discomfort is usually felt in the top of the uterus and spreads to the lower abdomen and low back.  True labor can be determined by your caregiver with an exam. This will show that the cervix is dilating and getting thinner. If there are no prenatal problems or other health problems associated with the pregnancy, it is completely safe to be sent home with false labor and await the onset of true labor. HOME CARE INSTRUCTIONS   Keep up with your usual exercises and instructions.  Take medications as  directed.  Keep your regular prenatal appointment.  Eat and drink lightly if you think you are going into labor.  If BH contractions are making you uncomfortable:  Change your activity position from lying down or resting to walking/walking to resting.  Sit and rest in a tub of warm water.  Drink 2 to 3 glasses of water. Dehydration may cause B-H contractions.  Do slow and deep breathing several times an hour. SEEK IMMEDIATE MEDICAL CARE IF:   Your contractions continue to become stronger, more regular, and closer together.  You have a gushing, burst or leaking of fluid from the vagina.  An oral temperature above 102 F (38.9 C) develops.  You have passage of blood-tinged mucus.  You develop vaginal bleeding.  You develop continuous belly (abdominal) pain.  You have low back pain that you never had before.  You feel the baby's head pushing down causing pelvic pressure.  The baby is not moving as much as it used to. Document Released: 10/09/2005 Document Revised: 01/01/2012 Document Reviewed: 07/21/2013 Exit[employer] Patient Information 2014 [employer].  Fetal Movement Counts Patient Name: __________________________________________________ Patient Due Date: ____________________ Performing a fetal movement count is highly recommended in high-risk pregnancies, but it is good for every pregnant woman to do. Your caregiver may ask you to start counting fetal movements at 28 weeks of the pregnancy. Fetal movements often increase:  After eating a full meal.  After physical activity.  After eating or drinking something sweet or cold.  At rest. Pay attention to when you feel   the baby is most active. This will help you notice a pattern of your baby's sleep and wake cycles and what factors contribute to an increase in fetal movement. It is important to perform a fetal movement count at the same time each day when your baby is normally most active.  HOW TO COUNT FETAL  MOVEMENTS 1. Find a quiet and comfortable area to sit or lie down on your left side. Lying on your left side provides the best blood and oxygen circulation to your baby. 2. Write down the day and time on a sheet of paper or in a journal. 3. Start counting kicks, flutters, swishes, rolls, or jabs in a 2 hour period. You should feel at least 10 movements within 2 hours. 4. If you do not feel 10 movements in 2 hours, wait 2 3 hours and count again. Look for a change in the pattern or not enough counts in 2 hours. SEEK MEDICAL CARE IF:  You feel less than 10 counts in 2 hours, tried twice.  There is no movement in over an hour.  The pattern is changing or taking longer each day to reach 10 counts in 2 hours.  You feel the baby is not moving as he or she usually does. Date: ____________ Movements: ____________ Start time: ____________ Finish time: ____________  Date: ____________ Movements: ____________ Start time: ____________ Finish time: ____________ Date: ____________ Movements: ____________ Start time: ____________ Finish time: ____________ Date: ____________ Movements: ____________ Start time: ____________ Finish time: ____________ Date: ____________ Movements: ____________ Start time: ____________ Finish time: ____________ Date: ____________ Movements: ____________ Start time: ____________ Finish time: ____________ Date: ____________ Movements: ____________ Start time: ____________ Finish time: ____________ Date: ____________ Movements: ____________ Start time: ____________ Finish time: ____________  Date: ____________ Movements: ____________ Start time: ____________ Finish time: ____________ Date: ____________ Movements: ____________ Start time: ____________ Finish time: ____________ Date: ____________ Movements: ____________ Start time: ____________ Finish time: ____________ Date: ____________ Movements: ____________ Start time: ____________ Finish time: ____________ Date: ____________  Movements: ____________ Start time: ____________ Finish time: ____________ Date: ____________ Movements: ____________ Start time: ____________ Finish time: ____________ Date: ____________ Movements: ____________ Start time: ____________ Finish time: ____________  Date: ____________ Movements: ____________ Start time: ____________ Finish time: ____________ Date: ____________ Movements: ____________ Start time: ____________ Finish time: ____________ Date: ____________ Movements: ____________ Start time: ____________ Finish time: ____________ Date: ____________ Movements: ____________ Start time: ____________ Finish time: ____________ Date: ____________ Movements: ____________ Start time: ____________ Finish time: ____________ Date: ____________ Movements: ____________ Start time: ____________ Finish time: ____________ Date: ____________ Movements: ____________ Start time: ____________ Finish time: ____________  Date: ____________ Movements: ____________ Start time: ____________ Finish time: ____________ Date: ____________ Movements: ____________ Start time: ____________ Finish time: ____________ Date: ____________ Movements: ____________ Start time: ____________ Finish time: ____________ Date: ____________ Movements: ____________ Start time: ____________ Finish time: ____________ Date: ____________ Movements: ____________ Start time: ____________ Finish time: ____________ Date: ____________ Movements: ____________ Start time: ____________ Finish time: ____________ Date: ____________ Movements: ____________ Start time: ____________ Finish time: ____________  Date: ____________ Movements: ____________ Start time: ____________ Finish time: ____________ Date: ____________ Movements: ____________ Start time: ____________ Finish time: ____________ Date: ____________ Movements: ____________ Start time: ____________ Finish time: ____________ Date: ____________ Movements: ____________ Start time:  ____________ Finish time: ____________ Date: ____________ Movements: ____________ Start time: ____________ Finish time: ____________ Date: ____________ Movements: ____________ Start time: ____________ Finish time: ____________ Date: ____________ Movements: ____________ Start time: ____________ Finish time: ____________  Date: ____________ Movements: ____________ Start time: ____________ Finish time: ____________ Date: ____________ Movements: ____________ Start   time: ____________ Finish time: ____________ Date: ____________ Movements: ____________ Start time: ____________ Finish time: ____________ Date: ____________ Movements: ____________ Start time: ____________ Finish time: ____________ Date: ____________ Movements: ____________ Start time: ____________ Finish time: ____________ Date: ____________ Movements: ____________ Start time: ____________ Finish time: ____________ Date: ____________ Movements: ____________ Start time: ____________ Finish time: ____________  Date: ____________ Movements: ____________ Start time: ____________ Finish time: ____________ Date: ____________ Movements: ____________ Start time: ____________ Finish time: ____________ Date: ____________ Movements: ____________ Start time: ____________ Finish time: ____________ Date: ____________ Movements: ____________ Start time: ____________ Finish time: ____________ Date: ____________ Movements: ____________ Start time: ____________ Finish time: ____________ Date: ____________ Movements: ____________ Start time: ____________ Finish time: ____________ Date: ____________ Movements: ____________ Start time: ____________ Finish time: ____________  Date: ____________ Movements: ____________ Start time: ____________ Finish time: ____________ Date: ____________ Movements: ____________ Start time: ____________ Finish time: ____________ Date: ____________ Movements: ____________ Start time: ____________ Finish time: ____________ Date:  ____________ Movements: ____________ Start time: ____________ Finish time: ____________ Date: ____________ Movements: ____________ Start time: ____________ Finish time: ____________ Date: ____________ Movements: ____________ Start time: ____________ Finish time: ____________ Document Released: 11/08/2006 Document Revised: 09/25/2012 Document Reviewed: 08/05/2012 [employer] Patient Information 2014 [employer].  

## 2014-02-10 NOTE — Anesthesia Preprocedure Evaluation (Signed)
Anesthesia Evaluation  Patient identified by MRN, date of birth, ID band Patient awake    Reviewed: Allergy & Precautions, H&P , Patient's Chart, lab work & pertinent test results  Airway Mallampati: II TM Distance: >3 FB Neck ROM: full    Dental  (+) Teeth Intact   Pulmonary  breath sounds clear to auscultation        Cardiovascular Rhythm:regular Rate:Normal     Neuro/Psych    GI/Hepatic   Endo/Other    Renal/GU      Musculoskeletal   Abdominal   Peds  Hematology   Anesthesia Other Findings   Plts 126; no evidence of Preeclampsia    Reproductive/Obstetrics (+) Pregnancy                           Anesthesia Physical Anesthesia Plan  ASA: II  Anesthesia Plan: Epidural   Post-op Pain Management:    Induction:   Airway Management Planned:   Additional Equipment:   Intra-op Plan:   Post-operative Plan:   Informed Consent: I have reviewed the patients History and Physical, chart, labs and discussed the procedure including the risks, benefits and alternatives for the proposed anesthesia with the patient or authorized representative who has indicated his/her understanding and acceptance.   Dental Advisory Given  Plan Discussed with:   Anesthesia Plan Comments: (Labs checked- platelets confirmed with RN in room. Fetal heart tracing, per RN, reported to be stable enough for sitting procedure. Discussed epidural, and patient consents to the procedure:  included risk of possible headache,backache, failed block, allergic reaction, and nerve injury. This patient was asked if she had any questions or concerns before the procedure started.)        Anesthesia Quick Evaluation

## 2014-02-10 NOTE — Progress Notes (Signed)
Megan BilletValeria Hammond is a 18 y.o. G1P0 at 7973w0d by ultrasound admitted for induction of labor due to Post dates. Due date 02/10/14 and fetal decels.  Subjective: CTSP secondary to FHR decels immediately post epidural with some concomitant hyperstimulation.  Given terbutaline and ephedrine and FHR recovered to low 100's after 12-13 mins. Initial loss of variability recovered after d/c pitoicn, oxygen, position changes. Second decel prompted cervical check with change noted 45 mins later.  Objective: BP 105/62  Pulse 76  Temp(Src) 98.5 F (36.9 C) (Oral)  Resp 18  Ht 5\' 4"  (1.626 m)  Wt 175 lb (79.379 kg)  BMI 30.02 kg/m2  SpO2 100%  LMP 05/06/2013      FHT:  FHR: 150 bpm, variability: minimal ,  accelerations:  Abscent,  decelerations:  Present variable in nature UC:   regular, every 3-4 minutes SVE:   Dilation: 4.5 Effacement (%): 100 Station: -2 Exam by:: Dr Shawnie PonsPratt  Labs: Lab Results  Component Value Date   WBC 17.2* 02/10/2014   HGB 13.6 02/10/2014   HCT 39.4 02/10/2014   MCV 86.6 02/10/2014   PLT 126* 02/10/2014    Assessment / Plan: Spontaneous labor, progressing normally FHR with decels - amnioinfusion started and going.  Internals in place Labor: Progressing normally Preeclampsia:  no signs or symptoms of toxicity Fetal Wellbeing:  Category II continue periodic assessment Pain Control:  Epidural I/D:  n/a Anticipated MOD:  NSVD-consent for C-section performed--will proceed as necessary.  Reva Boresanya S Bonnye Halle 02/10/2014, 11:55 PM

## 2014-02-10 NOTE — MAU Note (Signed)
Patient states she is having contractions every 3-5 minutes with bloody show. Denies leaking and reports good fetal movement. States she has had vomiting all day and now has blood in the vomit. Has not been able to keep anything down today.

## 2014-02-11 ENCOUNTER — Encounter (HOSPITAL_COMMUNITY): Payer: Self-pay | Admitting: *Deleted

## 2014-02-11 DIAGNOSIS — O480 Post-term pregnancy: Secondary | ICD-10-CM

## 2014-02-11 DIAGNOSIS — O99892 Other specified diseases and conditions complicating childbirth: Secondary | ICD-10-CM

## 2014-02-11 DIAGNOSIS — O9989 Other specified diseases and conditions complicating pregnancy, childbirth and the puerperium: Secondary | ICD-10-CM

## 2014-02-11 LAB — RPR

## 2014-02-11 MED ORDER — TETANUS-DIPHTH-ACELL PERTUSSIS 5-2.5-18.5 LF-MCG/0.5 IM SUSP: 0.5000 mL | Freq: Once | INTRAMUSCULAR | Status: DC

## 2014-02-11 MED ORDER — OXYTOCIN 10 UNIT/ML IJ SOLN
INTRAMUSCULAR | Status: AC
  Filled 2014-02-11: qty 4

## 2014-02-11 MED ORDER — DIBUCAINE 1 % RE OINT
1.0000 "application " | TOPICAL_OINTMENT | RECTAL | Status: DC | PRN
  Filled 2014-02-11: qty 28

## 2014-02-11 MED ORDER — MEASLES, MUMPS & RUBELLA VAC ~~LOC~~ INJ: 0.5000 mL | INJECTION | Freq: Once | SUBCUTANEOUS | Status: DC

## 2014-02-11 MED ORDER — PRENATAL MULTIVITAMIN CH
1.0000 | ORAL_TABLET | Freq: Every day | ORAL | Status: DC
  Administered 2014-02-12 – 2014-02-13 (×2): 1 via ORAL
  Filled 2014-02-11 (×2): qty 1

## 2014-02-11 MED ORDER — ZOLPIDEM TARTRATE 5 MG PO TABS: 5.0000 mg | ORAL_TABLET | Freq: Every evening | ORAL | Status: DC | PRN

## 2014-02-11 MED ORDER — DIPHENHYDRAMINE HCL 25 MG PO CAPS: 25.0000 mg | ORAL_CAPSULE | Freq: Four times a day (QID) | ORAL | Status: DC | PRN

## 2014-02-11 MED ORDER — SENNOSIDES-DOCUSATE SODIUM 8.6-50 MG PO TABS
2.0000 | ORAL_TABLET | ORAL | Status: DC
  Administered 2014-02-11 – 2014-02-12 (×2): 2 via ORAL
  Filled 2014-02-11 (×2): qty 2

## 2014-02-11 MED ORDER — OXYCODONE-ACETAMINOPHEN 5-325 MG PO TABS
1.0000 | ORAL_TABLET | ORAL | Status: DC | PRN
  Administered 2014-02-11: 1 via ORAL
  Filled 2014-02-11: qty 1
  Filled 2014-02-11: qty 2

## 2014-02-11 MED ORDER — MORPHINE SULFATE 0.5 MG/ML IJ SOLN
INTRAMUSCULAR | Status: AC
Start: 2014-02-11 — End: 2014-02-11
  Filled 2014-02-11: qty 10

## 2014-02-11 MED ORDER — IBUPROFEN 600 MG PO TABS
600.0000 mg | ORAL_TABLET | Freq: Four times a day (QID) | ORAL | Status: DC
Start: 2014-02-11 — End: 2014-02-13
  Administered 2014-02-11 – 2014-02-13 (×6): 600 mg via ORAL
  Filled 2014-02-11 (×8): qty 1

## 2014-02-11 MED ORDER — SODIUM BICARBONATE 8.4 % IV SOLN
INTRAVENOUS | Status: AC
  Filled 2014-02-11: qty 50

## 2014-02-11 MED ORDER — MISOPROSTOL 200 MCG PO TABS: 800.0000 ug | ORAL_TABLET | Freq: Once | ORAL | Status: DC

## 2014-02-11 MED ORDER — MISOPROSTOL 200 MCG PO TABS
ORAL_TABLET | ORAL | Status: AC
Start: 2014-02-11 — End: 2014-02-11
  Filled 2014-02-11: qty 4

## 2014-02-11 MED ORDER — WITCH HAZEL-GLYCERIN EX PADS
1.0000 "application " | MEDICATED_PAD | CUTANEOUS | Status: DC | PRN
  Administered 2014-02-13: 1 via TOPICAL

## 2014-02-11 MED ORDER — BENZOCAINE-MENTHOL 20-0.5 % EX AERO
1.0000 "application " | INHALATION_SPRAY | CUTANEOUS | Status: DC | PRN
  Administered 2014-02-11: 1 via TOPICAL
  Filled 2014-02-11 (×2): qty 56

## 2014-02-11 MED ORDER — LIDOCAINE-EPINEPHRINE (PF) 2 %-1:200000 IJ SOLN
INTRAMUSCULAR | Status: AC
  Filled 2014-02-11: qty 20

## 2014-02-11 MED ORDER — SIMETHICONE 80 MG PO CHEW: 80.0000 mg | CHEWABLE_TABLET | ORAL | Status: DC | PRN

## 2014-02-11 MED ORDER — ONDANSETRON HCL 4 MG/2ML IJ SOLN: 4.0000 mg | INTRAMUSCULAR | Status: DC | PRN

## 2014-02-11 MED ORDER — LANOLIN HYDROUS EX OINT: TOPICAL_OINTMENT | CUTANEOUS | Status: DC | PRN

## 2014-02-11 MED ORDER — ONDANSETRON HCL 4 MG PO TABS: 4.0000 mg | ORAL_TABLET | ORAL | Status: DC | PRN

## 2014-02-11 MED ORDER — ONDANSETRON HCL 4 MG/2ML IJ SOLN
INTRAMUSCULAR | Status: AC
  Filled 2014-02-11: qty 2

## 2014-02-11 NOTE — Progress Notes (Signed)
Megan BilletValeria Hammond is a 18 y.o. G1P0 at 6329w1d by ultrasound admitted for active labor  Subjective: Feeling lots of pressure  Objective: BP 97/48  Pulse 109  Temp(Src) 99 F (37.2 C) (Oral)  Resp 18  Ht 5\' 4"  (1.626 m)  Wt 175 lb (79.379 kg)  BMI 30.02 kg/m2  SpO2 100%  LMP 05/06/2013   Total I/O In: -  Out: 300 [Urine:300]  FHT:  FHR: 150 bpm, variability: moderate,  accelerations:  Present,  decelerations:  Present moderate to severe variables with contractions UC:   regular, every 2-3 minutes SVE:   Dilation: 9 Effacement (%): 100 Station: +1 Exam by:: [employer]att  Labs: Lab Results  Component Value Date   WBC 17.2* 02/10/2014   HGB 13.6 02/10/2014   HCT 39.4 02/10/2014   MCV 86.6 02/10/2014   PLT 126* 02/10/2014    Assessment / Plan: Spontaneous labor, progressing normally  Labor: Progressing normally Preeclampsia:  no signs or symptoms of toxicity Fetal Wellbeing:  Category II Pain Control:  Epidural I/D:  n/a Anticipated MOD:  NSVD may be OP.  FHR with improved variability, + scalp stim.  Will await NSVD  Megan Hammond 02/11/2014, 4:13 AM

## 2014-02-11 NOTE — H&P (Signed)
Attestation of Attending Supervision of Advanced Practitioner (PA/CNM/NP): Evaluation and management procedures were performed by the Advanced Practitioner under my supervision and collaboration.  I have reviewed the Advanced Practitioner's note and chart, and I agree with the management and plan.  Dimitrious Micciche S Jaice Lague, MD [facility] Attending 02/11/2014 1:43 AM   

## 2014-02-11 NOTE — Consult Note (Signed)
Neonatology Note:   Attendance at Delivery:    I was asked by Dr. Ike Benedom to attend this vacuum-assisted vaginal delivery at term. The mother is a G1P0 O pos, GBS pos with persistent FHR decelerations during labor. An amnioinfusion was done. Mother received Pen G beginning about 6 hours before delivery. ROM 11 hours prior to delivery, fluid clear. Infant was floppy, blue, and apneic, with a HR > 100. We bulb suctioned for scant clear fluid and gave vigorous stimulation, but he remained apneic, so PPV was done for 30 seconds. He began to breathe, then cried out loud at about 1.5-2 minutes. We placed a pulse oximeter and his color improved steadily. His O2 saturation was 85% at 4 min (normal). By 5 minutes, his muscle tone was normal and color and perfusion were good. Ap 4/9. Lungs clear to ausc in DR, no distress. To CN to care of Pediatrician.   Doretha Souhristie C. Lilith Solana, MD

## 2014-02-11 NOTE — Progress Notes (Signed)
Heather hogan cnm called d/t decel to view and come to room

## 2014-02-11 NOTE — Lactation Note (Signed)
This note was copied from the chart of [name]. Lactation Consultation Note Initial consult:  P1, Mother's nipples invert when compressed.  Good flow of colostrum expressed.  Spoonfed baby 1 ml. Assisted mother in fb hold.  Able to latch baby with "teacup" compression but baby does not sustain latch.  He is sleepy. Reviewed reverse pressure and hand pump prior to latching. Mother put shells in her bra to evert nipples.   Encouraged mother to call for assistance with next feeding.  Patient Name: Boy Megan BilletValeria Hammond ZOXWR'UToday's Date: 02/11/2014 Reason for consult: Initial assessment   Maternal Data Infant to breast within first hour of birth: Yes Has patient been taught Hand Expression?: Yes Does the patient have breastfeeding experience prior to this delivery?: No  Feeding Feeding Type: Breast Fed Length of feed: 15 min (on and off)  LATCH Score/Interventions Latch: Repeated attempts needed to sustain latch, nipple held in mouth throughout feeding, stimulation needed to elicit sucking reflex. Intervention(s): Adjust position;Assist with latch;Breast massage;Breast compression  Audible Swallowing: A few with stimulation Intervention(s): Hand expression;Skin to skin;Alternate breast massage  Type of Nipple: Flat (inverts when compressed) Intervention(s): Shells;Hand pump  Comfort (Breast/Nipple): Soft / non-tender     Hold (Positioning): Assistance needed to correctly position infant at breast and maintain latch.  LATCH Score: 6  Lactation Tools Discussed/Used Tools: Shells;Pump Shell Type: Inverted Breast pump type: Manual   Consult Status Consult Status: Follow-up Date: 02/12/14 Follow-up type: In-patient    Dulce SellarRuth Boschen Berkelhammer 02/11/2014, 12:59 PM

## 2014-02-11 NOTE — Progress Notes (Signed)
Megan BilletValeria Hammond is a 18 y.o. G1P0 at 5344w1d by ultrasound admitted for active labor  Subjective:   Objective: BP 137/65  Pulse 139  Temp(Src) 98.5 F (36.9 C) (Oral)  Resp 18  Ht 5\' 4"  (1.626 m)  Wt 175 lb (79.379 kg)  BMI 30.02 kg/m2  SpO2 100%  LMP 05/06/2013      FHT:  FHR: 150 bpm, variability: minimal ,  accelerations:  Abscent,  decelerations:  Present mild to moderate variables UC:   regular, every 3-4 minutes SVE:   Dilation: 8.5 Effacement (%): 100 Station: -1 Exam by:: Dr Shawnie PonsPratt  Labs: Lab Results  Component Value Date   WBC 17.2* 02/10/2014   HGB 13.6 02/10/2014   HCT 39.4 02/10/2014   MCV 86.6 02/10/2014   PLT 126* 02/10/2014    Assessment / Plan: Spontaneous labor, progressing normally Category 2 tracing---continue to watch--abdominal delivery with severe repetitive variables or bradycardia Labor: Progressing normally Preeclampsia:  no signs or symptoms of toxicity Fetal Wellbeing:  Category II Pain Control:  Epidural I/D:  n/a Anticipated MOD:  NSVD  Reva Boresanya S Pratt 02/11/2014, 1:46 AM

## 2014-02-11 NOTE — Anesthesia Postprocedure Evaluation (Signed)
Anesthesia Post Note  Patient: Megan Hammond  Procedure(s) Performed: * No procedures listed *  Anesthesia type: Epidural  Patient location: Mother/Baby  Post pain: Pain level controlled  Post assessment: Post-op Vital signs reviewed  Last Vitals:  Filed Vitals:   02/11/14 1725  BP: 105/70  Pulse: 94  Temp: 37.1 C  Resp: 18    Post vital signs: Reviewed  Level of consciousness:alert  Complications: No apparent anesthesia complications

## 2014-02-11 NOTE — Progress Notes (Signed)
Dr Ike Beneodom and heather hogan cnm at bedside

## 2014-02-11 NOTE — Progress Notes (Signed)
nicu called to attend delivery

## 2014-02-12 NOTE — Clinical Social Work Maternal (Signed)
Clinical Social Work Department PSYCHOSOCIAL ASSESSMENT - MATERNAL/CHILD 02/12/2014  Patient:  Megan Hammond, Megan Hammond  Account Number:  [id number]  Admit Date:  02/10/2014  Megan Hammond    Clinical Social Worker:  Gerri Spore, LCSW   Date/Time:  02/12/2014 03:33 PM  Date Referred:  02/12/2014   Referral source  NICU     Referred reason  NICU   Other referral source:    I:  FAMILY / [place] legal guardian:  PARENT  Guardian - Name Guardian - Age Guardian - Address  Megan Hammond 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  The couple appears to be doing well emotionally however does express some sadness about NICU admission. MOB states she is "upset" that she has to leave him after she is discharge.  FOB states he too is "upset but feelings reassured" that the baby is doing well.  The couple seems to understand the reason for the NICU admission & satisfied with the communication they are receiving from staff.  Both parents deny history of depression.  The couple has the support of their parents. They have selected Dr. Louanne Skye as the infants pediatrician.  They have reliable  transportation & understand the importance of visiting with the infant.  The young couple have all the necessary supplies for the infant.  CSW encouraged the couple to use CSW as a resource if needed.  CSW will continue to follow & assist family until discharge.      VI SOCIAL WORK PLAN Social Work Plan  Psychosocial Support/Ongoing Assessment of Needs   Type of pt/family education:   If child protective services report - county:   If child protective services report - date:   Information/referral to community resources comment:   Other social work plan:

## 2014-02-12 NOTE — Lactation Note (Signed)
This note was copied from the chart of Megan Hammond. Lactation Consultation Note    Follow up consult with this mom of a NICU baby, now 27 hours post partum, 40 2/7 weeks corrected gestation, weighing 6 - 10.9, with a diagnosis of  desats and oxygen requirements. He was admitted at aobut 10 hours of age. Mom has begun pumping with DEP, and expressed 35 mls from her right breast, and drops from her left. I did teaching with her on hand expression, premie setting, and reviewed the NICU booklet on how to provide EBM for the NICU baby.  Mom eager to provide EBm for her baby, and is awre lactation is available to assist her with latching her baby, while he is the NICU.   Patient Name: Megan Dena BilletValeria Cacho ZOXWR'UToday's Date: 02/12/2014     Maternal Data    Feeding    LATCH Score/Interventions                      Lactation Tools Discussed/Used     Consult Status      Alfred LevinsChristine Anne Macie Baum 02/12/2014, 4:00 PM

## 2014-02-12 NOTE — Progress Notes (Signed)
Post Partum Day 1 Subjective: no complaints, up ad lib, voiding and tolerating PO  Objective: Blood pressure 113/70, pulse 71, temperature 97.6 F (36.4 C), temperature source Oral, resp. rate 16, height 5\' 4"  (1.626 m), weight 79.379 kg (175 lb), last menstrual period 05/06/2013, SpO2 98.00%, unknown if currently breastfeeding.  Physical Exam:  General: alert, cooperative and no distress Lochia: appropriate Uterine Fundus: firm Incision: na DVT Evaluation: No cords or calf tenderness. No significant calf/ankle edema.   Recent Labs  02/10/14 2042  HGB 13.6  HCT 39.4    Assessment/Plan: Plan for discharge tomorrow, Breastfeeding, Lactation consult, Circumcision prior to discharge and Contraception undecided   LOS: 2 days   Laurie Penado L Aliena Ghrist 02/12/2014, 7:55 AM

## 2014-02-13 ENCOUNTER — Ambulatory Visit (HOSPITAL_COMMUNITY): Payer: BC Managed Care – PPO

## 2014-02-13 DIAGNOSIS — Z8759 Personal history of other complications of pregnancy, childbirth and the puerperium: Secondary | ICD-10-CM

## 2014-02-13 MED ORDER — IBUPROFEN 600 MG PO TABS: 600.0000 mg | ORAL_TABLET | Freq: Four times a day (QID) | ORAL | Status: DC

## 2014-02-13 NOTE — Discharge Summary (Signed)
Attestation of Attending Supervision of Obstetric Fellow: Evaluation and management procedures were performed by the Obstetric Fellow under my supervision and collaboration.  I have reviewed the Obstetric Fellow's note and chart, and I agree with the management and plan.  UGONNA  ANYANWU, MD, FACOG Attending Obstetrician & Gynecologist Faculty Practice, [facility]   

## 2014-02-13 NOTE — Discharge Summary (Signed)
Obstetric Discharge Summary Reason for Admission: onset of labor, FHR variable decels, x1 prolonged decel  Prenatal Procedures: NST Intrapartum Procedures: IUPC, amnioinfusion, required vacuum assisted delivery Postpartum Procedures: none Complications-Operative and Postpartum: none Hemoglobin  Date Value Ref Range Status  02/10/2014 13.6  12.0 - 15.0 g/dL Final     HCT  Date Value Ref Range Status  02/10/2014 39.4  36.0 - 46.0 % Final    Discharge Diagnoses: Term Pregnancy-delivered and vacuum assisted  Hospital Course:  Megan BilletValeria Hammond is a 18 y.o. G1P1001 who presented with SOL at term. Following admission, continued FHT monitoring with variable decels and x1 prolonged decel, continued to progress in labor, had IUPC and amnioinfusion. Within 24 hours of admission after adequate PCN treatment for GBS (pos), patient was complete and +4, with difficulty progressing to delivery, persistent non-reassuring FHT with bradycardia 70s x 8 mins, vacuum assist delivery successfully performed. Female infant born with NICU in attendance, APGAR 4/9, required stim, PPV 30 sec, improved by 4 min, later transferred to NICU for prolonged IV antibiotics with hypothermia and cyanosis.  Postpartum course was uncomplicated, and patient was able to ambulate, tolerate PO and void normally. Plan to continue pumping breastmilk, and for contraception considering Mirena IUD. She was discharged home with instructions for postpartum care.  Operative Delivery Note  At 8:09 AM a viable female was delivered via Vaginal, Vacuum Investment banker, operational(Extractor). Presentation: vertex; Position: Left,, Occiput,, Anterior; Station: +4.  Verbal consent: obtained from patient. Risks and benefits discussed in detail. Risks include, but are not limited to the risks of anesthesia, bleeding, infection, damage to maternal tissues, fetal cephalhematoma. There is also the risk of inability to effect vaginal delivery of the head, or shoulder dystocia that cannot be  resolved by established maneuvers, leading to the need for emergency cesarean section. FHR decels had been going on all evening and had progressively gotten worse with some hyperstimulation prior to my arrival. VE showed pt. To be complete and + 4. Pushed x 1 with little movement to affect delivery with FHR in the 70's x 8 minutes. Kiwi applied and pulled with one contraction with easy descent and delivery. Cord clamped x 2 and taken to waiting peds. Cord blood and pH obtained. Placenta delivered spontaneously and intact with 3 VC. Mild atony resolved with removal of clot from LUS. 800 mcg of Cytotec and IV Pitocin given.  APGAR: 4, 9; weight .  Placenta status: Intact, Spontaneous.  Cord: 3 vessels with the following complications: . Cord pH: 7.13  Anesthesia: Epidural  Instruments: Kiwi vacuum  Episiotomy: None  Lacerations: Small labial  Est. Blood Loss (mL): 500  Mom to postpartum. Baby to Couplet care / Skin to Skin.  Reva Boresanya S Pratt  02/11/2014, 9:18 AM   Physical Exam:  General: alert and cooperative Lochia: appropriate Uterine Fundus: firm DVT Evaluation: No evidence of DVT seen on physical exam. Negative Homan's sign. No cords or calf tenderness. No significant calf/ankle edema.  Discharge Information: Date: 02/13/2014 Activity: pelvic rest Diet: routine Medications: PNV and Ibuprofen Baby feeding: plans to breastfeed (currently pumping, while baby in NICU) Contraception: Mirena IUD, considering Condition: stable Instructions: refer to practice specific booklet Discharge to: home   Newborn Data: Live born female  Birth Weight: 6 lb 8.6 oz (2965 g) APGAR: 4, 9  To remain hospitalized in NICU on IV antibiotics.  Saralyn PilarAlexander Karamalegos, DO [facility], PGY-1  02/13/2014, 9:15 AM   I spoke with and examined patient and agree with resident's note and  plan of care.  Tawana ScaleMichael Ryan Eiliana Drone, MD OB Fellow 02/13/2014 9:40 AM

## 2014-02-13 NOTE — Discharge Instructions (Signed)

## 2014-02-17 ENCOUNTER — Encounter: Payer: Self-pay | Admitting: Advanced Practice Midwife

## 2014-03-30 ENCOUNTER — Ambulatory Visit (INDEPENDENT_AMBULATORY_CARE_PROVIDER_SITE_OTHER): Payer: BC Managed Care – PPO | Admitting: Advanced Practice Midwife

## 2014-03-30 NOTE — Patient Instructions (Signed)
For lactation: Feed baby/pump more often Try Fenugreek supplements Lactation Cookies--especially chocolate chip!

## 2014-03-30 NOTE — Progress Notes (Signed)
  Subjective:     Megan Hammond is a 18 y.o. female who presents for a postpartum visit. She is 7 weeks postpartum following a VAVD. I have fully reviewed the prenatal and intrapartum course. The delivery was at 40 gestational weeks. Outcome: vacuum, low. Anesthesia: epidural. Postpartum course has been normal. Baby's course has been abnormal, NICU x1 month but home and progressing well now. Baby is feeding by breast.  Pt pumping mostly due to poor weight gain in beginning but baby latching well now. Is concerned about milk supply and how much baby is getting at each feeding. . Bleeding lochia stopped x1 week, then onset of bleeding like menses this week. Bowel function is normal. Bladder function is normal. Patient is not sexually active. Contraception method is none. Postpartum depression screening: negative.  The following portions of the patient's history were reviewed and updated as appropriate: allergies, current medications, past family history, past medical history, past social history, past surgical history and problem list.  Review of Systems A comprehensive review of systems was negative.   Objective:    BP 108/61  Pulse 59  Resp 16  Ht 5' 3.5" (1.613 m)  Wt 150 lb (68.04 kg)  BMI 26.15 kg/m2  LMP 03/26/2014  Breastfeeding? Yes  General:  alert, cooperative and no distress   Breasts:  inspection negative, no nipple discharge or bleeding, no masses or nodularity palpable  Lungs: clear to auscultation bilaterally  Heart:  regular rate and rhythm, S1, S2 normal, no murmur, click, rub or gallop  Abdomen: soft, non-tender; bowel sounds normal; no masses,  no organomegaly        Assessment:     Normal postpartum exam.    Plan:    1. Contraception: Discussed contraceptive options with LARCs presented as most effective. Interested in [place]. Printed materials given, pt to make appointment in next couple of weeks.   2. Discussed breastfeeding challenges.  Since infant latching  well now and continued weight checks with baby's provider, recommend transition to latching for feedings and pumping less.  Recommend make appointment with [facility]ation at [facility] to bring baby and evaluate feeding.  Discussed ways to increase milk supply including increase number of feedings, use of Fenugreek, and lactation cookies with brewer's yeast.   3. Follow up in: a few days for Nexplanon placement or as needed.

## 2014-04-14 ENCOUNTER — Ambulatory Visit (INDEPENDENT_AMBULATORY_CARE_PROVIDER_SITE_OTHER): Payer: BC Managed Care – PPO | Admitting: Obstetrics & Gynecology

## 2014-04-14 ENCOUNTER — Encounter: Payer: Self-pay | Admitting: Obstetrics & Gynecology

## 2014-04-14 VITALS — BP 120/76 | HR 72 | Resp 16 | Ht 63.0 in | Wt 148.0 lb

## 2014-04-14 DIAGNOSIS — Z01812 Encounter for preprocedural laboratory examination: Secondary | ICD-10-CM

## 2014-04-14 DIAGNOSIS — Z30013 Encounter for initial prescription of injectable contraceptive: Secondary | ICD-10-CM

## 2014-04-14 DIAGNOSIS — Z30017 Encounter for initial prescription of implantable subdermal contraceptive: Secondary | ICD-10-CM

## 2014-04-14 LAB — POCT URINE PREGNANCY: PREG TEST UR: NEGATIVE

## 2014-04-14 MED ORDER — ETONOGESTREL 68 MG ~~LOC~~ IMPL
68.0000 mg | DRUG_IMPLANT | Freq: Once | SUBCUTANEOUS | Status: AC
  Administered 2014-04-14: 68 mg via SUBCUTANEOUS

## 2014-04-14 NOTE — Progress Notes (Signed)
   Subjective:    Patient ID: Megan BilletValeria Hammond, female    DOB: 07/20/1996, 18 y.o.   MRN: [id number]  HPI  She is here for Nexplanon insertion. She is aware that irregular bleeding is a common side effect.  Review of Systems     Objective:   Physical Exam  UPT was negative. Consent was signed. Time out procedure was done. Her left arm was prepped with betadine and infiltrated with 3 cc of 1% lidocaine. After adequate anesthesia was assured, the Nexplanon device was placed according to standard of care. Her arm was hemostatic and was bandaged. She tolerated the procedure well.       Assessment & Plan:  Contraception- Nexplanon RTC 1 month for follow up

## 2014-04-14 NOTE — Addendum Note (Signed)
Addended by: Granville LewisLARK, LORA L on: 04/14/2014 04:13 PM   Modules accepted: Orders

## 2014-04-14 NOTE — Addendum Note (Signed)
Addended by: Granville LewisLARK, LORA L on: 04/14/2014 02:21 PM   Modules accepted: Orders

## 2014-05-14 ENCOUNTER — Ambulatory Visit: Payer: BC Managed Care – PPO | Admitting: Obstetrics & Gynecology

## 2014-06-03 ENCOUNTER — Ambulatory Visit: Payer: BC Managed Care – PPO | Admitting: Obstetrics & Gynecology

## 2014-08-24 ENCOUNTER — Encounter: Payer: Self-pay | Admitting: Obstetrics & Gynecology

## 2015-05-12 ENCOUNTER — Ambulatory Visit: Payer: Medicaid Other | Admitting: Family Medicine

## 2015-05-25 ENCOUNTER — Ambulatory Visit (INDEPENDENT_AMBULATORY_CARE_PROVIDER_SITE_OTHER): Payer: 59 | Admitting: Family Medicine

## 2015-05-25 ENCOUNTER — Encounter: Payer: Self-pay | Admitting: Family Medicine

## 2015-05-25 VITALS — BP 100/59 | HR 77 | Ht 63.0 in | Wt 146.0 lb

## 2015-05-25 DIAGNOSIS — R1031 Right lower quadrant pain: Secondary | ICD-10-CM

## 2015-05-25 DIAGNOSIS — N898 Other specified noninflammatory disorders of vagina: Secondary | ICD-10-CM | POA: Diagnosis not present

## 2015-05-25 DIAGNOSIS — R5383 Other fatigue: Secondary | ICD-10-CM | POA: Diagnosis not present

## 2015-05-25 DIAGNOSIS — F418 Other specified anxiety disorders: Secondary | ICD-10-CM | POA: Diagnosis not present

## 2015-05-25 LAB — WET PREP, GENITAL
Trich, Wet Prep: NONE SEEN
WBC, Wet Prep HPF POC: NONE SEEN
YEAST WET PREP: NONE SEEN

## 2015-05-25 NOTE — Progress Notes (Signed)
Subjective:    Patient ID: Megan Hammond, female    DOB: 1995/12/08, 19 y.o.   MRN: [id number]  HPI 19 year old female who comes here today to establish care. Her parents are established patients here in our clinic. She has been to health system before by her GYN down the hall. She currently has implanon In for birth control fo one year. She would like to discuss stomach pains today. She says hasn't felt very motivated to clean her house and take care of things. After having a baby she has felt very down and "blah". Has felt more tired. No major complications during her pregnancy.    On her review of systems she had noted that she does feel down. We did a PHQ 9 questionnaire on her today and her score was 14 her gad 7 score was 21.  Has occ right lower quad pain.  She had an odor and took an OTC one day tx Monistat.  Her sxs are better.  Occurred July 14th. No prior history.  She occasionally notices the pain during intercourse but it's not consistent. Her periods are somewhat irregular since having the Nexplanon placed about a year ago.  Review of Systems  Constitutional: Negative for fever, diaphoresis and unexpected weight change.  HENT: Negative for hearing loss, rhinorrhea and tinnitus.   Eyes: Negative for visual disturbance.  Respiratory: Negative for cough and wheezing.   Cardiovascular: Negative for chest pain and palpitations.  Gastrointestinal: Negative for nausea, vomiting, diarrhea and blood in stool.  Genitourinary: Positive for vaginal discharge. Negative for vaginal bleeding and difficulty urinating.  Musculoskeletal: Negative for myalgias and arthralgias.  Skin: Negative for rash.  Neurological: Negative for headaches.  Hematological: Negative for adenopathy. Does not bruise/bleed easily.  Psychiatric/Behavioral: Positive for sleep disturbance. Negative for dysphoric mood. The patient is nervous/anxious.    BP 100/59 mmHg  Pulse 77  Ht 5\' 3"  (1.6 m)  Wt 146 lb (66.225  kg)  BMI 25.87 kg/m2  LMP 05/18/2015 (LMP Unknown)  Breastfeeding? Unknown    No Known Allergies  No past medical history on file.  Past Surgical History  Procedure Laterality Date  . No past surgeries      History   Social History  . Marital Status: Significant Other    Spouse Name: Mariah Milling  . Number of Children: 1  . Years of Education: N/A   Occupational History  . College student    Social History Main Topics  . Smoking status: Never Smoker   . Smokeless tobacco: Never Used  . Alcohol Use: 1.2 oz/week    2 Glasses of wine per week  . Drug Use: No  . Sexual Activity: Yes    Birth Control/ Protection: None   Other Topics Concern  . 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 She currently lives with her boyfriend and son and one dog. 2 caffeinated drinks daily. No regular exercise.    Family History  Problem Relation Age of Onset  . Diabetes Mellitus II Father   . Hypertension Father     Outpatient Encounter Prescriptions as of 05/25/2015  Medication Sig  . [DISCONTINUED] ibuprofen (ADVIL,MOTRIN) 600 MG tablet Take 1 tablet (600 mg total) by mouth every 6 (six) hours.  . [DISCONTINUED] prenatal vitamin w/FE, FA (NATACHEW) 29-1 MG CHEW chewable tablet Chew 1 tablet by mouth at bedtime.    No facility-administered encounter medications on file as  of 05/25/2015.          Objective:   Physical Exam  Constitutional: She is oriented to person, place, and time. She appears well-developed and well-nourished.  HENT:  Head: Normocephalic and atraumatic.  Cardiovascular: Normal rate, regular rhythm and normal heart sounds.   Pulmonary/Chest: Effort normal and breath sounds normal.  Abdominal: Soft. Bowel sounds are normal. She exhibits no distension. There is no tenderness. There is no rebound and no guarding.  Neurological: She is alert and oriented to person, place, and time.  Skin: Skin is  warm and dry.  Psychiatric: She has a normal mood and affect. Her behavior is normal.          Assessment & Plan:  Fatigue - recommend evaluate for anemia, thyroid problems, etc.  I think could really be mood related.   Depression/anxiety- gad 7 score of 21 today which is consistent with severe anxiety and PHQ 9 score of 14 which is consistent with moderate depression. We discussed treatment options including therapy/counseling and possibly medication such as an SSRI. I encouraged her to think about doing both. She says she will think about it and let me know. She is not breast-feeding and would have no other contraindication to medication treatment at this time. And she is not planning on becoming pregnant.  RLQ pain, intermittant- could be her ovary causing discomfort. No new sexual partners.  Recommend schedule her pap with gyn.    Vaginitis - wet prep performed.

## 2015-05-26 ENCOUNTER — Telehealth: Payer: Self-pay | Admitting: *Deleted

## 2015-05-26 DIAGNOSIS — F418 Other specified anxiety disorders: Secondary | ICD-10-CM

## 2015-05-26 LAB — TSH: TSH: 1.057 u[IU]/mL (ref 0.350–4.500)

## 2015-05-26 LAB — CBC WITH DIFFERENTIAL/PLATELET
Basophils Absolute: 0 10*3/uL (ref 0.0–0.1)
Basophils Relative: 0 % (ref 0–1)
EOS ABS: 0.1 10*3/uL (ref 0.0–0.7)
EOS PCT: 2 % (ref 0–5)
HCT: 39.8 % (ref 36.0–46.0)
HEMOGLOBIN: 13.3 g/dL (ref 12.0–15.0)
LYMPHS ABS: 2.9 10*3/uL (ref 0.7–4.0)
LYMPHS PCT: 47 % — AB (ref 12–46)
MCH: 28.2 pg (ref 26.0–34.0)
MCHC: 33.4 g/dL (ref 30.0–36.0)
MCV: 84.3 fL (ref 78.0–100.0)
MONOS PCT: 8 % (ref 3–12)
MPV: 10.1 fL (ref 8.6–12.4)
Monocytes Absolute: 0.5 10*3/uL (ref 0.1–1.0)
NEUTROS ABS: 2.6 10*3/uL (ref 1.7–7.7)
NEUTROS PCT: 43 % (ref 43–77)
Platelets: 234 10*3/uL (ref 150–400)
RBC: 4.72 MIL/uL (ref 3.87–5.11)
RDW: 13.6 % (ref 11.5–15.5)
WBC: 6.1 10*3/uL (ref 4.0–10.5)

## 2015-05-26 LAB — VITAMIN B12: Vitamin B-12: 373 pg/mL (ref 211–911)

## 2015-05-26 LAB — FERRITIN: Ferritin: 13 ng/mL (ref 10–291)

## 2015-05-26 MED ORDER — FLUOXETINE HCL 10 MG PO CAPS: ORAL_CAPSULE | ORAL | Status: DC

## 2015-05-26 NOTE — Telephone Encounter (Signed)
Pt called and stated that her insurance is not in network with [facility]. She is asking that the script be sent to [facility]. Called [facility] and asked that this be cancelled and will send to new pharm.Marland KitchenMarland KitchenLoralee Pacas Hammond

## 2015-05-26 NOTE — Telephone Encounter (Signed)
Call patient: Going to go ahead and start her on prescription medication: Fluoxetine. Just follow the instructions on the bottle. Follow-up in 3-4 weeks here in the office. Have her schedule an appointment. I'm also going to place a referral to [employer] who is a therapist or locally who I think she would do well with.

## 2015-05-26 NOTE — Telephone Encounter (Signed)
Pt called and stated that she would like to start on an SSRI med and would like a referral for counseling or wanted to know if Dr. Linford Arnold had any recommendations for who she should see. Will fwd to pcp for advice.Megan Hammond

## 2015-05-26 NOTE — Telephone Encounter (Signed)
Called and informed pt of recommendations via VM advised to rtn call if any questions.Megan Hammond [place]

## 2015-06-17 ENCOUNTER — Ambulatory Visit: Payer: 59 | Admitting: Family Medicine

## 2015-06-18 ENCOUNTER — Encounter: Payer: Self-pay | Admitting: Family Medicine

## 2015-06-18 ENCOUNTER — Ambulatory Visit (INDEPENDENT_AMBULATORY_CARE_PROVIDER_SITE_OTHER): Payer: 59 | Admitting: Family Medicine

## 2015-06-18 VITALS — BP 99/61 | HR 72 | Ht 63.0 in | Wt 144.0 lb

## 2015-06-18 DIAGNOSIS — N760 Acute vaginitis: Secondary | ICD-10-CM

## 2015-06-18 DIAGNOSIS — A499 Bacterial infection, unspecified: Secondary | ICD-10-CM | POA: Diagnosis not present

## 2015-06-18 DIAGNOSIS — F321 Major depressive disorder, single episode, moderate: Secondary | ICD-10-CM | POA: Diagnosis not present

## 2015-06-18 DIAGNOSIS — B9689 Other specified bacterial agents as the cause of diseases classified elsewhere: Secondary | ICD-10-CM

## 2015-06-18 MED ORDER — FLUOXETINE HCL 20 MG PO CAPS: 20.0000 mg | ORAL_CAPSULE | Freq: Every day | ORAL | Status: DC

## 2015-06-18 MED ORDER — METRONIDAZOLE 500 MG PO TABS: 500.0000 mg | ORAL_TABLET | Freq: Two times a day (BID) | ORAL | Status: DC

## 2015-06-18 NOTE — Progress Notes (Signed)
   Subjective:    Patient ID: Megan Hammond, female    DOB: [birth year], 19 y.o.   MRN: [id number]  HPI 3 week f/u for moderate depression.  We discussed starting medication versus counseling or therapy when I saw her about 3 weeks ago. She called back and said she wanted to start a medication so we started her on fluoxetine. She does still complain of feeling nervous and on edge several days of the week and feeling like she worries too much. She does feel free to something awful might happen more than half the days. She also complains of little interest or pleasure doing things several days a week and feeling down several days of the week. She also completed some trouble concentrating. No S.E on the medication.   We will call her with results of her wet prep consistent with bacterial vaginitis. Unfortunately we never sent a prescription for metronidazole. Her right lower quadrant pain has improved.  Review of Systems     Objective:   Physical Exam  Constitutional: She is oriented to person, place, and time. She appears well-developed and well-nourished.  HENT:  Head: Normocephalic and atraumatic.  Cardiovascular: Normal rate, regular rhythm and normal heart sounds.   Pulmonary/Chest: Effort normal and breath sounds normal.  Neurological: She is alert and oriented to person, place, and time.  Skin: Skin is warm and dry.  Psychiatric: She has a normal mood and affect. Her behavior is normal.          Assessment & Plan:  Depression/anxiety-PHQ 9 score of 7 today, previous 14. Dad 7 score of 6 today, previous of 21. She has had at least a 50% reduction in depressive symptoms and a 70% reduction in anxiety symptoms. She still rates her symptoms is somewhat difficult. Overall she's had a fantastic response of the last 3 weeks. Discussed continue with current regimen for another 6 weeks. If at that point she is still not maximally controlled then we will increase her dose to 40 mg. And certainly  if she feels like she wants to go up on her dose before then she can just give the office a call. Next  Bacterial vaginitis-we'll go ahead and send her perception for metronidazole.

## 2015-08-02 ENCOUNTER — Ambulatory Visit: Payer: 59 | Admitting: Family Medicine

## 2015-08-24 ENCOUNTER — Encounter: Payer: Self-pay | Admitting: Family Medicine

## 2015-08-24 ENCOUNTER — Ambulatory Visit (INDEPENDENT_AMBULATORY_CARE_PROVIDER_SITE_OTHER): Payer: 59 | Admitting: Family Medicine

## 2015-08-24 VITALS — BP 107/54 | HR 73 | Ht 63.0 in | Wt 146.0 lb

## 2015-08-24 DIAGNOSIS — F32A Depression, unspecified: Secondary | ICD-10-CM

## 2015-08-24 DIAGNOSIS — R3915 Urgency of urination: Secondary | ICD-10-CM | POA: Diagnosis not present

## 2015-08-24 DIAGNOSIS — F329 Major depressive disorder, single episode, unspecified: Secondary | ICD-10-CM | POA: Diagnosis not present

## 2015-08-24 DIAGNOSIS — F331 Major depressive disorder, recurrent, moderate: Secondary | ICD-10-CM

## 2015-08-24 DIAGNOSIS — N3281 Overactive bladder: Secondary | ICD-10-CM

## 2015-08-24 MED ORDER — BUPROPION HCL ER (XL) 150 MG PO TB24: 150.0000 mg | ORAL_TABLET | ORAL | Status: DC

## 2015-08-24 MED ORDER — FLUOXETINE HCL 40 MG PO CAPS: 40.0000 mg | ORAL_CAPSULE | Freq: Every day | ORAL | Status: DC

## 2015-08-24 NOTE — Progress Notes (Signed)
   Subjective:    Patient ID: Megan Hammond, female    DOB: [birth year], 19 y.o.   MRN: [id number]  HPI F/U depression  - she's here today to follow-up for depression-she had started on fluoxetine in August. She does complain of feeling down and quite anxious and difficulty with concentration. She is now working at [employer]. General MillsShe's doing well medication without any major side effects. She still feels like her to biggest concerns are lack of focus and concentration and low energy.  Says feels like she urinates frequent. She feels like she gets some urgency. Says even as a kid she had to pee a lot. Says hard to hold her urine.  No burning.     Review of Systems     Objective:   Physical Exam  Constitutional: She is oriented to person, place, and time. She appears well-developed and well-nourished.  HENT:  Head: Normocephalic and atraumatic.  Cardiovascular: Normal rate, regular rhythm and normal heart sounds.   Pulmonary/Chest: Effort normal and breath sounds normal.  Neurological: She is alert and oriented to person, place, and time.  Skin: Skin is warm and dry.  Psychiatric: She has a normal mood and affect. Her behavior is normal.          Assessment & Plan:  Urinary urgency - she is young to have OAB and sounds like this has been a problem since youth. wil refer to Urology for further evaluation. We discussed that she may actually have overactive bladder versus a truly physiologic small bladder. I think if she's never been evaluated or had a workup and since the symptoms are not new recommend referral to urology. I think she'll probably need an ultrasound and urine flow dynamic testing.  Acute depressive episode-PHQ 9 score of 10 which is up from previous of 7 and Gad 7 score of 8 which is up from previous of 6. She denies any new or acute stressors that have triggered increase in symptoms. We discussed several options. We can try increasing the fluoxetine to 60 mg versus adding a second  agent such as Wellbutrin which may help more with focus. She opted to try the Wellbutrin and I will see her back in 6 weeks. If she's doing fantastic on her regimen then we can change her follow-up to 3 months.

## 2015-10-05 ENCOUNTER — Ambulatory Visit: Payer: 59 | Admitting: Family Medicine

## 2015-12-07 ENCOUNTER — Telehealth: Payer: Self-pay

## 2015-12-07 NOTE — Telephone Encounter (Signed)
Yes, lets try to get her in.  We can put her with Dorene Grebe if I don't have anything

## 2015-12-08 ENCOUNTER — Ambulatory Visit (INDEPENDENT_AMBULATORY_CARE_PROVIDER_SITE_OTHER): Payer: 59

## 2015-12-08 ENCOUNTER — Encounter: Payer: Self-pay | Admitting: Osteopathic Medicine

## 2015-12-08 ENCOUNTER — Other Ambulatory Visit: Payer: Self-pay | Admitting: Osteopathic Medicine

## 2015-12-08 ENCOUNTER — Ambulatory Visit (INDEPENDENT_AMBULATORY_CARE_PROVIDER_SITE_OTHER): Payer: 59 | Admitting: Osteopathic Medicine

## 2015-12-08 VITALS — BP 128/69 | HR 79 | Ht 64.0 in | Wt 158.0 lb

## 2015-12-08 DIAGNOSIS — N939 Abnormal uterine and vaginal bleeding, unspecified: Secondary | ICD-10-CM | POA: Diagnosis not present

## 2015-12-08 DIAGNOSIS — R102 Pelvic and perineal pain: Secondary | ICD-10-CM

## 2015-12-08 DIAGNOSIS — F32A Depression, unspecified: Secondary | ICD-10-CM

## 2015-12-08 DIAGNOSIS — N83202 Unspecified ovarian cyst, left side: Secondary | ICD-10-CM

## 2015-12-08 DIAGNOSIS — F329 Major depressive disorder, single episode, unspecified: Secondary | ICD-10-CM | POA: Diagnosis not present

## 2015-12-08 DIAGNOSIS — Z3201 Encounter for pregnancy test, result positive: Secondary | ICD-10-CM | POA: Diagnosis not present

## 2015-12-08 LAB — POCT URINE PREGNANCY: Preg Test, Ur: NEGATIVE

## 2015-12-08 NOTE — Patient Instructions (Signed)
APPOINTMENT FOR PELVIC ULTRASOUND 3:30 TODAY. DRINK PLENTY OF WATER - IMAGES ARE MORE ACCURATE IF YOUR BLADDER IS FULL. WE WILL CALL YOU WHEN BLOOD TEST AND ULTRASOUND RESULTS ARE AVAILABLE - THESE RESULTS WILL TELL us HOW TO PROCEED.   ANY WORSENING OF PAIN OR BLEEDING, OR ANY FEVER/CHILLS OR FOUL ODOR ASSOCIATED WITH THE VAGINAL BLEEDING/DISCHARGE YOU NEED TO GO TO THE EMERGENCY ROOM.

## 2015-12-08 NOTE — Progress Notes (Signed)
HPI: Megan Hammond is a 20 y.o. female who presents to [facility] today for chief complaint of:  Chief Complaint  Patient presents with  . Abdominal Pain  . Vaginal Bleeding      . Context: patient reports that she will occasionally take a home pregnancy test just for peace of mind that the birth control is working. She states that she wasn't feeling unsual such as an nausea, vomiting, abdominal pain or any other signs or symptoms of pregnancy. (+) test 12/03/15 at home, (-) 12/04/15 multiple home tests and in urgent care (reviewed lab results in Care Everywhere and confirmed). . Modifying factors: Had nexplanon placed 2 years ago. Irregular bleeding since then.  . Assoc signs/symptoms: no fever/chills, reports cramping but no serious pain, reports R pelvic pain ongoing several months, worse with intercourse. She has irregular bleeding on Nexplanon but doesn't usually cramp which is part of the reason she is concerned now.    Past medical, social and family history reviewed: No past medical history on file. Past Surgical History  Procedure Laterality Date  . No past surgeries     Social History  Substance Use Topics  . Smoking status: Never Smoker   . Smokeless tobacco: Never Used  . Alcohol Use: 1.2 oz/week    2 Glasses of wine per week   Family History  Problem Relation Age of Onset  . Diabetes Mellitus II Father   . Hypertension Father     Current Outpatient Prescriptions  Medication Sig Dispense Refill  . buPROPion (WELLBUTRIN XL) 150 MG 24 hr tablet Take 1 tablet (150 mg total) by mouth every morning. 30 tablet 2  . FLUoxetine (PROZAC) 40 MG capsule Take 1 capsule (40 mg total) by mouth daily. 30 capsule 5   No current facility-administered medications for this visit.   No Known Allergies    Review of Systems: CONSTITUTIONAL:  No  fever CARDIAC: No  chest pain RESPIRATORY: No  cough, No  shortness of breath/wheeze GASTROINTESTINAL:  No  nausea, No  vomiting, (+) abdominal pain as per HPI, No  blood in stool, No  diarrhea, No  constipation  GENITOURINARY: No  incontinence, No  abnormal genital discharge SKIN: No  rash/wounds/concerning lesions PSYCHIATRIC: (+) concerns with depression, No  concerns with anxiety, No sleep problems   Exam:  BP 128/69 mmHg  Pulse 79  Ht  (1.626 m)  Wt 158 lb (71.668 kg)  BMI 27.11 kg/m2 Constitutional: VS see above. General Appearance: alert, well-developed, well-nourished, NAD Eyes: Normal lids and conjunctive, non-icteric sclera, Respiratory: Normal respiratory effort. no wheeze, no rhonchi, no rales Cardiovascular: S1/S2 normal, no murmur, no rub/gallop auscultated. RRR.  Gastrointestinal: Mild TTP RLQ, otherwise nontender, no masses. No hepatomegaly, no splenomegaly. No hernia appreciated. Bowel sounds normal. Rectal exam deferred.  Psychiatric: Normal judgment/insight. Normal mood and affect. Oriented x3.    Results for orders placed or performed in visit on 12/08/15 (from the past 72 hour(s))  POCT urine pregnancy     Status: None   Collection Time: 12/08/15  2:05 PM  Result Value Ref Range   Preg Test, Ur Negative Negative      ASSESSMENT/PLAN:   Given the patient is on fairly reliable birth control, question if the pregnancy test that she took was a false positive. Confirm with serum beta hCG. Current bleeding or mental diagnosis can include irregular bleeding due to Nexplanon, patient states that she has irregular bleeding on this anyway but usually no periods,  couldn't tell me her last normal period. However due to patient reports of cramping with this bleeding which she states is fairly unusual for her, she also reports long-term discomfort in the right lower pelvis particularly with intercourse, we'll go ahead and get an ultrasound to further evaluate long-standing pelvic pain as well as confirm or rule out intrauterine pregnancy or ectopic pregnancy. Based on US/lab  results and symptoms consider pelvic exam vs referral to GYN.   Pt also advised follow up with Dr Linford Arnold for management of antidepression medications, pt had some questions about if depression/mood problems can be correlated to birth control, I advised her there is some evidence these may be related but little in the way of proof of causal relationship.   Abnormal uterine and vaginal bleeding, unspecified - Plan: POCT urine pregnancy, B-HCG Quant, US Pelvis Complete  Pelvic pain in female - Plan: US Pelvis Complete  Positive urine pregnancy test - Plan: B-HCG Quant  Return if symptoms worsen or fail to improve.  ~~~~~~~~~~~~~~~~~~~~~~~~~~~~~~~  12/10/2015 UPDATE:    US Transvaginal Non-ob  12/08/2015  CLINICAL DATA:  Pelvic pain for 6 months.  Vaginal bleeding. EXAM: TRANSABDOMINAL AND TRANSVAGINAL ULTRASOUND OF PELVIS TECHNIQUE: Both transabdominal and transvaginal ultrasound examinations of the pelvis were performed. Transabdominal technique was performed for global imaging of the pelvis including uterus, ovaries, adnexal regions, and pelvic cul-de-sac. It was necessary to proceed with endovaginal exam following the transabdominal exam to visualize the uterus, endometrium and ovaries. COMPARISON:  None FINDINGS: Uterus Measurements: 7.6 x 4.9 x 5.4 cm. No fibroids or other mass visualized. Endometrium Thickness: 5 mm.  No focal abnormality visualized. Right ovary Measurements: 3.3 x 2.2 x 1.9 cm. Normal appearance/no adnexal mass. Left ovary Measurements: 4 x 2.7 x 3.0 cm. Simple appearing anechoic cyst measures 3.6 x 2.6 x 2.3 cm. Normal appearance/no adnexal mass. Other findings Small amount of free fluid. IMPRESSION: 1. No acute findings. 2. Left ovary cyst. This is almost certainly benign, and no specific imaging follow up is recommended according to the Society of Radiologists in Ultrasound2010 Consensus Conference Statement (D Lenis Noon et al. Management of Asymptomatic Ovarian and Other  Adnexal Cysts Imaged at [place]: Society of Radiologists in Ultrasound Consensus Conference Statement 2010. Radiology 256 (Sept 2010): 943-954.). Electronically Signed   By: Signa Kell M.D.   On: 12/08/2015 16:17   US Pelvis Complete  12/08/2015  CLINICAL DATA:  Pelvic pain for 6 months.  Vaginal bleeding. EXAM: TRANSABDOMINAL AND TRANSVAGINAL ULTRASOUND OF PELVIS TECHNIQUE: Both transabdominal and transvaginal ultrasound examinations of the pelvis were performed. Transabdominal technique was performed for global imaging of the pelvis including uterus, ovaries, adnexal regions, and pelvic cul-de-sac. It was necessary to proceed with endovaginal exam following the transabdominal exam to visualize the uterus, endometrium and ovaries. COMPARISON:  None FINDINGS: Uterus Measurements: 7.6 x 4.9 x 5.4 cm. No fibroids or other mass visualized. Endometrium Thickness: 5 mm.  No focal abnormality visualized. Right ovary Measurements: 3.3 x 2.2 x 1.9 cm. Normal appearance/no adnexal mass. Left ovary Measurements: 4 x 2.7 x 3.0 cm. Simple appearing anechoic cyst measures 3.6 x 2.6 x 2.3 cm. Normal appearance/no adnexal mass. Other findings Small amount of free fluid. IMPRESSION: 1. No acute findings. 2. Left ovary cyst. This is almost certainly benign, and no specific imaging follow up is recommended according to the Society of Radiologists in Ultrasound2010 Consensus Conference Statement (D Lenis Noon et al. Management of Asymptomatic Ovarian and Other Adnexal Cysts Imaged at [place]: Society of Radiologists in  Ultrasound Consensus Conference Statement 2010. Radiology 256 (Sept 2010): 943-954.). Electronically Signed   By: Signa Kell M.D.   On: 12/08/2015 16:17    Results for orders placed or performed in visit on 12/08/15 (from the past 72 hour(s))  POCT urine pregnancy     Status: None   Collection Time: 12/08/15  2:05 PM  Result Value Ref Range   Preg Test, Ur Negative Negative  B-HCG Quant     Status: None    Collection Time: 12/08/15  2:31 PM  Result Value Ref Range   hCG, Beta Chain, Quant, S <2.0 mIU/mL    Comment:   Gestational Age    Expected hCG values (mIU/mL) <1 Week:                  5-50 1-2 Weeks:                50-500 2-3 Weeks:                [phone] 3-4 Weeks:                500-10000 4-5 Weeks:                1000-50000 5-6 Weeks:                10000-100000 6-8 Weeks:                15000-200000 2-3 Months:               10000-100000 The table above provides only a very rough estimate of gestational age and should be used only in conjunction with other methods for establishing gestational age. Much more reliable and accurate estimations of gestational age may be obtained by using LMP or ultrasound.   Values from different assay methods may vary. The use of this assay to monitor or to diagnose patients with cancer or any other condition unrelated to pregnancy has not been cleared or approved by the FDA or the manufacturer of the assay.    Negative beta hCG and no IUP or ectopic noted on ultrasound. Patient informed of results and all questions answered. 12:55 PM 12/10/2015

## 2015-12-09 ENCOUNTER — Encounter: Payer: Self-pay | Admitting: Osteopathic Medicine

## 2015-12-09 LAB — HCG, QUANTITATIVE, PREGNANCY

## 2015-12-10 DIAGNOSIS — F32A Depression, unspecified: Secondary | ICD-10-CM | POA: Insufficient documentation

## 2015-12-10 DIAGNOSIS — F329 Major depressive disorder, single episode, unspecified: Secondary | ICD-10-CM | POA: Insufficient documentation

## 2015-12-20 ENCOUNTER — Encounter: Payer: Self-pay | Admitting: Family Medicine

## 2015-12-20 ENCOUNTER — Ambulatory Visit (INDEPENDENT_AMBULATORY_CARE_PROVIDER_SITE_OTHER): Payer: BLUE CROSS/BLUE SHIELD | Admitting: Family Medicine

## 2015-12-20 ENCOUNTER — Ambulatory Visit: Payer: Self-pay | Admitting: Family Medicine

## 2015-12-20 VITALS — BP 110/58 | HR 75 | Wt 156.0 lb

## 2015-12-20 DIAGNOSIS — F32A Depression, unspecified: Secondary | ICD-10-CM

## 2015-12-20 DIAGNOSIS — F329 Major depressive disorder, single episode, unspecified: Secondary | ICD-10-CM | POA: Diagnosis not present

## 2015-12-20 DIAGNOSIS — F411 Generalized anxiety disorder: Secondary | ICD-10-CM | POA: Diagnosis not present

## 2015-12-20 MED ORDER — FLUOXETINE HCL 20 MG PO TABS: 60.0000 mg | ORAL_TABLET | Freq: Every day | ORAL | Status: DC

## 2015-12-20 NOTE — Progress Notes (Signed)
   Subjective:    Patient ID: Megan Hammond, female    DOB: [birth year], 20 y.o.   MRN: [id number]  HPI F/U acute depression - she felt the Wellbutrin was not helping and so she stopped it.  She has been more stressed at work. She feels like her anxiety has actually increased since I last saw her in November. She does complain of feeling nervous and on edge more than half the days and high levels of irritability. She says work is just very stressful for her over all. 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 He is still interested in doing some therapy and counseling the just worried about getting into her schedule.   Review of Systems     Objective:   Physical Exam  Constitutional: She is oriented to person, place, and time. She appears well-developed and well-nourished.  HENT:  Head: Normocephalic and atraumatic.  Cardiovascular: Normal rate, regular rhythm and normal heart sounds.   Pulmonary/Chest: Effort normal and breath sounds normal.  Neurological: She is alert and oriented to person, place, and time.  Skin: Skin is warm and dry.  Psychiatric: She has a normal mood and affect. Her behavior is normal.          Assessment & Plan:  Acute depression/anxiety-dad 7 score of 11 today, his is up from previous of 8. PHQ 9 score actually went down to 7, previous of 10. We discussed several options. Will discontinue Wellbutrin since it was not effective. Will increase fluoxetine to 60 mg. And strongly encouraged her to get in with a therapist/counselor. I discussed that we can even do FMLA if needed if she needs to leave work early to go to her appointments. I just think this would be incredibly helpful for her. Next  Time spent 20 minutes, greater than 50% times and counseling about depression and anxiety.  She thinks she did receive her tetanus shot encouraged her to check on this. Also reminded her to schedule appointment for Pap smear as she is due.

## 2016-01-31 ENCOUNTER — Ambulatory Visit: Payer: BLUE CROSS/BLUE SHIELD | Admitting: Family Medicine

## 2016-04-27 IMAGING — US US PELVIS COMPLETE
1 series · 13 of 25 positions shown · non-contrast
Comparison: None

CLINICAL DATA: Pelvic pain for 6 months.  Vaginal bleeding.

EXAM:
TRANSABDOMINAL AND TRANSVAGINAL ULTRASOUND OF PELVIS
TECHNIQUE: Both transabdominal and transvaginal ultrasound examinations of the
pelvis were performed. Transabdominal technique was performed for
global imaging of the pelvis including uterus, ovaries, adnexal
regions, and pelvic cul-de-sac. It was necessary to proceed with
endovaginal exam following the transabdominal exam to visualize the
uterus, endometrium and ovaries.

[Series 1: us pelvis complete · 0.24mm/px · 13 of 71 slices shown]
[im 1/71]
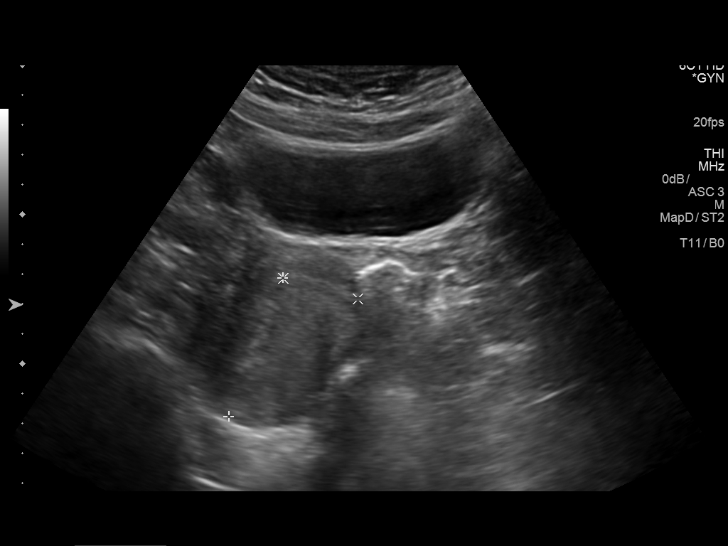
[im 6/71]
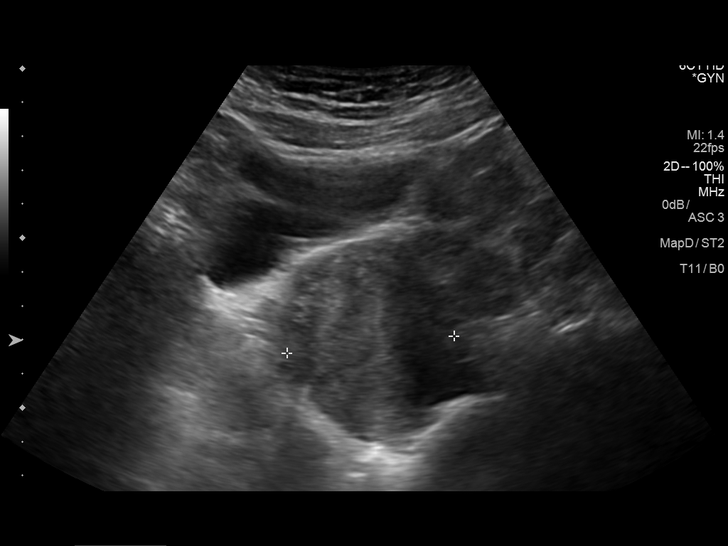
[im 12/71]
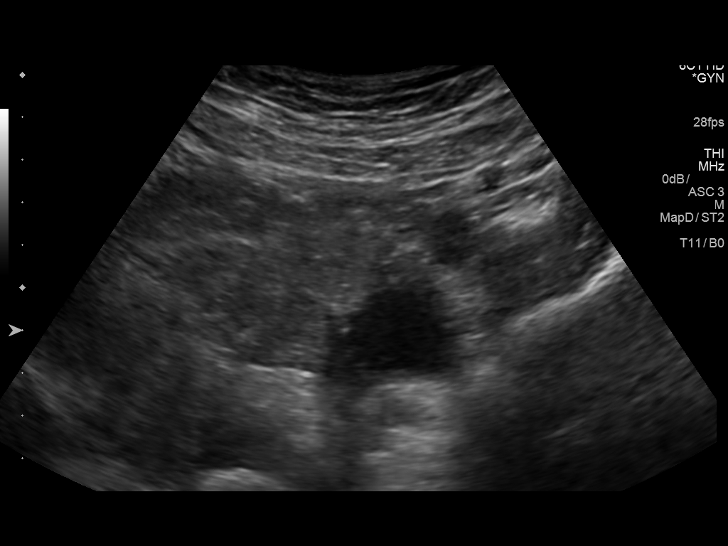
[im 18/71]
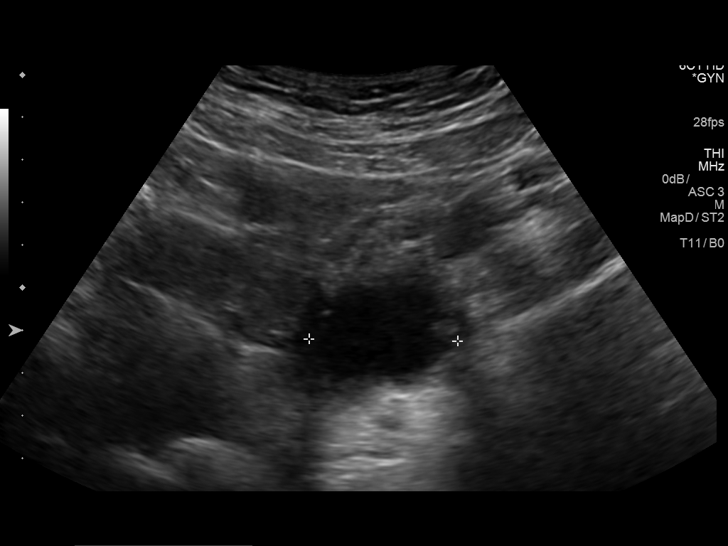
[im 24/71]
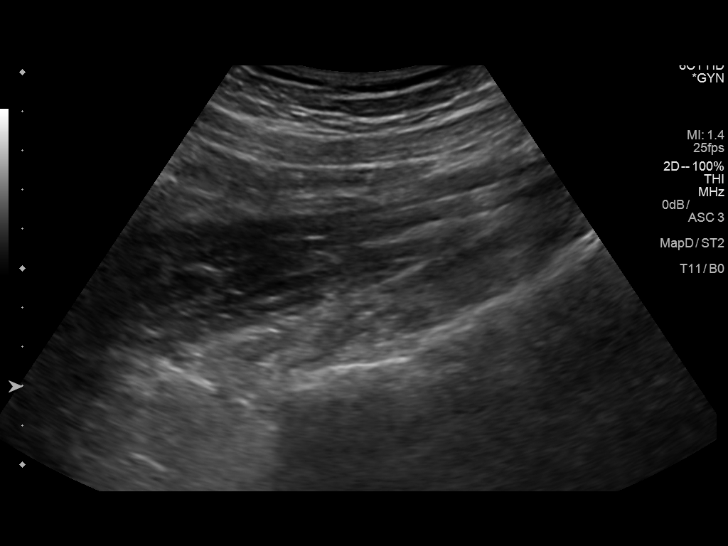
[im 30/71]
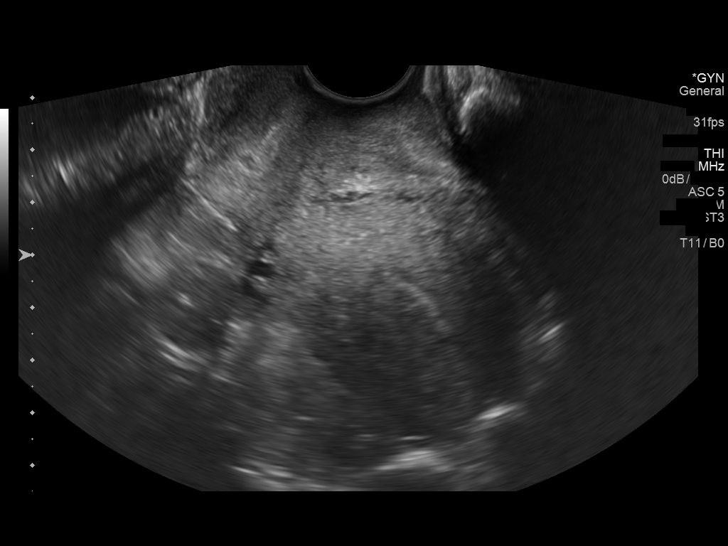
[im 36/71]
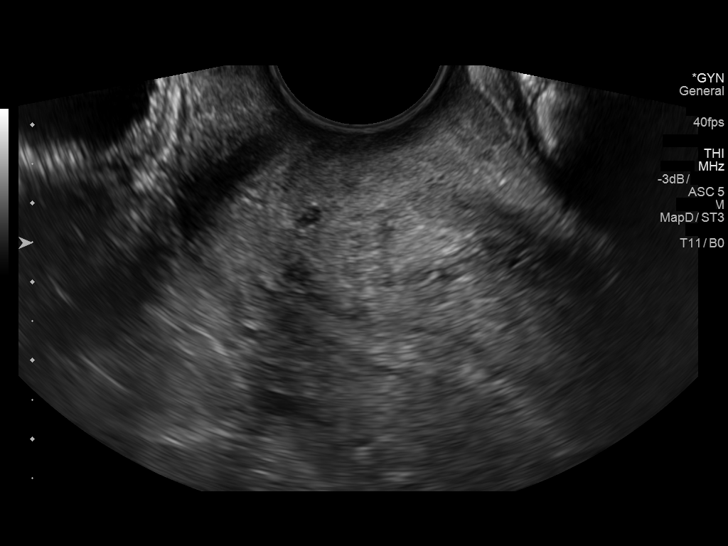
[im 41/71]
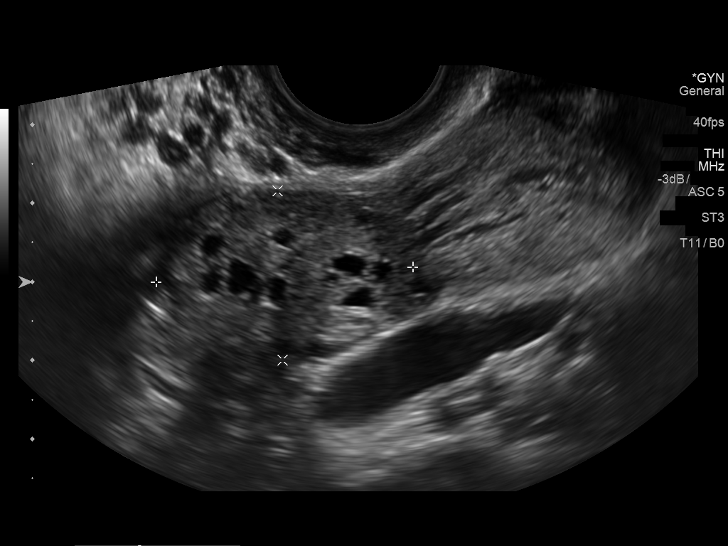
[im 47/71]
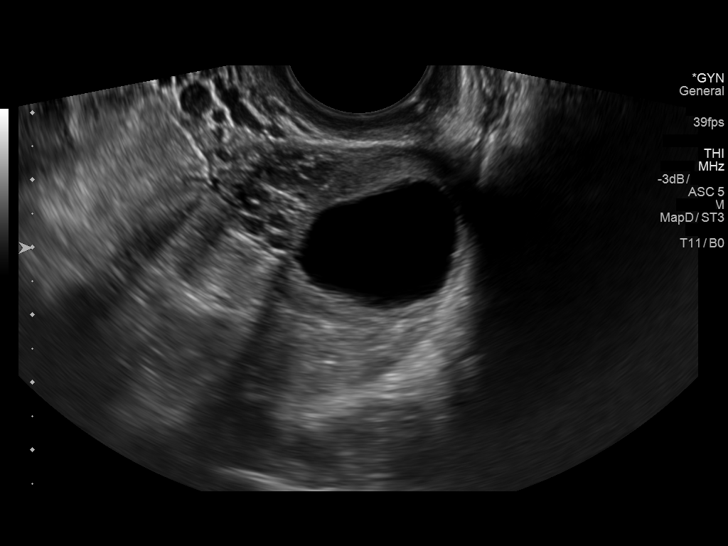
[im 53/71]
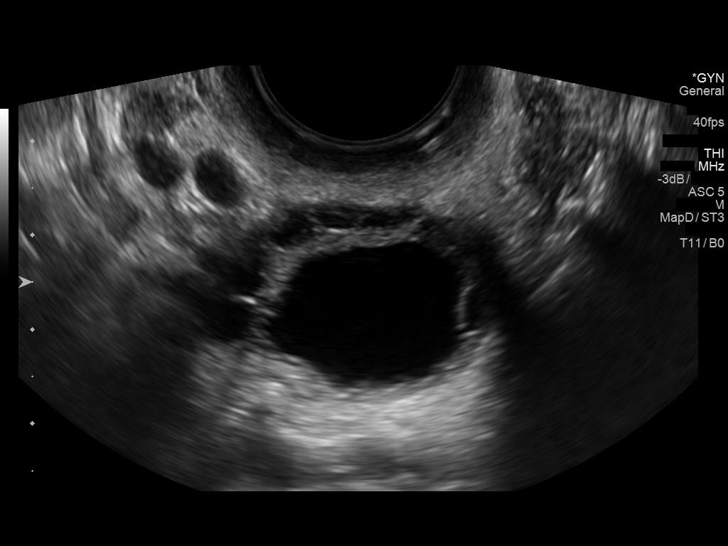
[im 59/71]
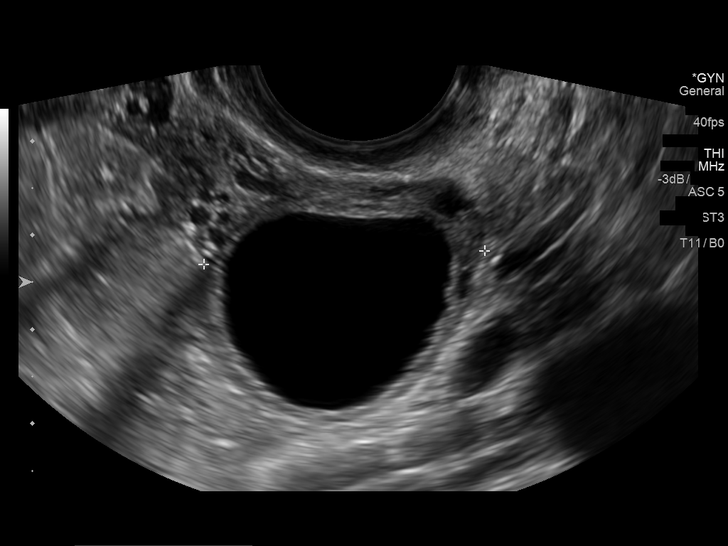
[im 65/71]
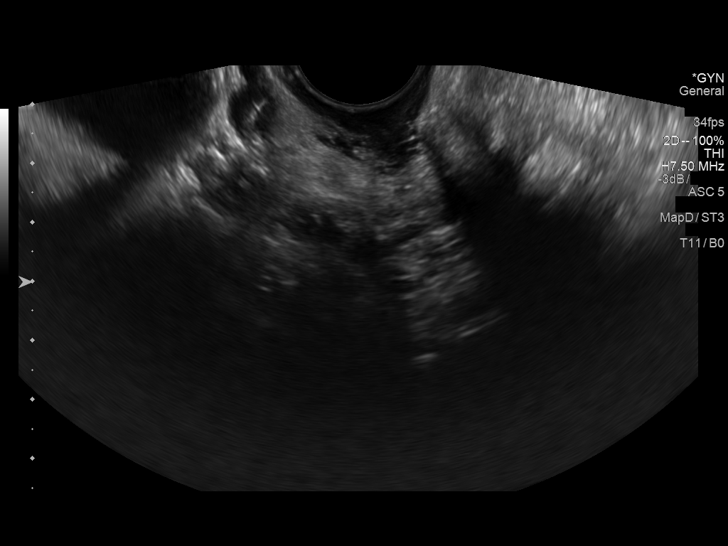
[im 71/71]
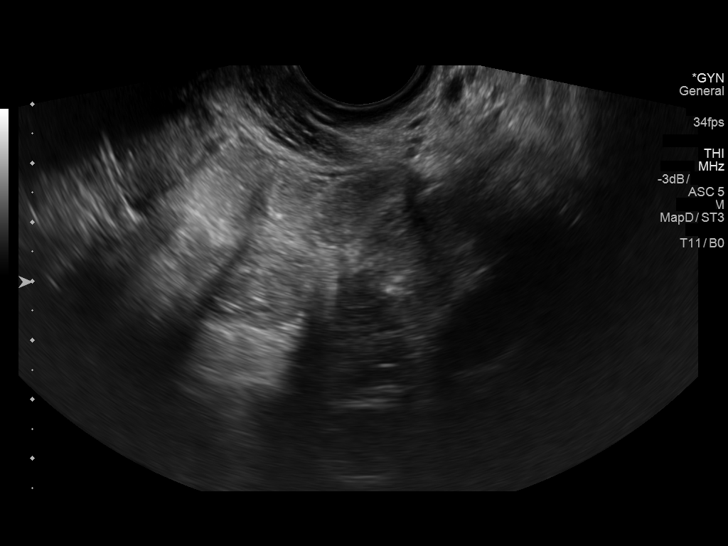

[13 of 25 positions shown; findings below may reference images not displayed]

FINDINGS: Uterus

Measurements: 7.6 x 4.9 x 5.4 cm.. No fibroids or other mass
visualized.

Endometrium

Thickness: 5 mm.  No focal abnormality visualized.

Right ovary

Measurements: 3.3 x 2.2 x 1.9 cm.. Normal appearance/no adnexal
mass.

Left ovary

Measurements: 4 x 2.7 x 3.0 cm. Simple appearing anechoic cyst
measures 3.6 x 2.6 x 2.3 cm.. Normal appearance/no adnexal mass.

Other findings

Small amount of free fluid.
IMPRESSION: 1. No acute findings.
2. Left ovary cyst. This is almost certainly benign, and no specific
imaging follow up is recommended according to the Society of
Radiologists in 6ltrasoundG3V3 Consensus Conference Statement (JOVEL
Nazareth et al. Management of Asymptomatic Ovarian and Other Adnexal
Cysts Imaged at [place]: Society of Radiologists in Ultrasound Consensus

## 2017-01-18 ENCOUNTER — Ambulatory Visit: Payer: BLUE CROSS/BLUE SHIELD | Admitting: Obstetrics & Gynecology

## 2018-01-08 ENCOUNTER — Ambulatory Visit: Payer: BLUE CROSS/BLUE SHIELD | Admitting: Physician Assistant

## 2018-01-08 ENCOUNTER — Encounter: Payer: Self-pay | Admitting: Physician Assistant

## 2018-01-08 VITALS — BP 123/75 | HR 83 | Ht 64.0 in | Wt 170.0 lb

## 2018-01-08 DIAGNOSIS — M7989 Other specified soft tissue disorders: Secondary | ICD-10-CM

## 2018-01-08 NOTE — Patient Instructions (Signed)
Will get u/s of axilla

## 2018-01-08 NOTE — Progress Notes (Signed)
   Subjective:    Patient ID: Megan Hammond, female    DOB: 1996/06/26, 22 y.o.   MRN: [id number]  HPI Patient is a 22 year old female who presents with a "lump" in the right arm axillary region. She states that she noticed it about 3 weeks ago while shaving and it has slowly gotten larger. She denies any fevers, chills, nausea, vomiting, erythema, warmth, or tenderness. She states that she feels a little tingling sensation to that area but denies any significant pain. She has not tried any at home remedies including warm compresses.   She is unaware of her breast cancer family hx due to being adopted.   .. Active Ambulatory Problems    Diagnosis Date Noted  . Status post vacuum-assisted vaginal delivery 02/13/2014  . Depression 12/10/2015   Resolved Ambulatory Problems    Diagnosis Date Noted  . Supervision of normal first teen pregnancy 06/30/2013  . Marginal insertion of umbilical cord 10/06/2013  . GBS (group B Streptococcus carrier), +RV culture, currently pregnant 01/14/2014  . Uterine size-date discrepancy in third trimester 02/09/2014  . Active labor at term 02/10/2014   No Additional Past Medical History      Review of Systems  Constitutional: Negative for chills and fever.  Gastrointestinal: Negative for nausea and vomiting.  Skin: Negative for color change and rash.       Positive for lump/swelling to the right axillary region       Objective:   Physical Exam  Constitutional: She is oriented to person, place, and time. She appears well-developed and well-nourished.  HENT:  Head: Normocephalic and atraumatic.  Cardiovascular: Normal rate, regular rhythm and normal heart sounds.  Pulmonary/Chest: Effort normal and breath sounds normal.  Lymphadenopathy:    She has no cervical adenopathy.  Neurological: She is alert and oriented to person, place, and time.  Skin: Skin is warm and dry.  Soft tissue swelling in the right and left axillary regions without erythema,  warmth, fluctuance, or induration. R > L  Psychiatric: She has a normal mood and affect. Her behavior is normal.          Assessment & Plan:  Marland Kitchen.Marland Kitchen.Diagnoses and all orders for this visit:  Right axillary swelling -     CBC with Differential/Platelet -     US BREAST LTD UNI RIGHT INC AXILLA   After comparing right axillary to left axillary I suspect this is a normal variant of soft tissue enlargement. I do not feel like this represents lymph node enlargement. There are no exam findings consistent with infection. I would like to check CBC and will order breast/axilla ultrasound. Follow up as needed or if symptoms worsen.

## 2018-01-09 LAB — CBC WITH DIFFERENTIAL/PLATELET
Basophils Absolute: 34 cells/uL (ref 0–200)
Basophils Relative: 0.4 %
EOS PCT: 0.9 %
Eosinophils Absolute: 76 cells/uL (ref 15–500)
HCT: 39.4 % (ref 35.0–45.0)
Hemoglobin: 13.7 g/dL (ref 11.7–15.5)
Lymphs Abs: 1436 cells/uL (ref 850–3900)
MCH: 29.7 pg (ref 27.0–33.0)
MCHC: 34.8 g/dL (ref 32.0–36.0)
MCV: 85.5 fL (ref 80.0–100.0)
MPV: 11.6 fL (ref 7.5–12.5)
Monocytes Relative: 8.1 %
NEUTROS ABS: 6174 {cells}/uL (ref 1500–7800)
Neutrophils Relative %: 73.5 %
PLATELETS: 225 10*3/uL (ref 140–400)
RBC: 4.61 10*6/uL (ref 3.80–5.10)
RDW: 12.2 % (ref 11.0–15.0)
TOTAL LYMPHOCYTE: 17.1 %
WBC mixed population: 680 cells/uL (ref 200–950)
WBC: 8.4 10*3/uL (ref 3.8–10.8)

## 2018-01-09 NOTE — Progress Notes (Signed)
Call pt: CBC looks perfect. No concerns.

## 2018-01-23 ENCOUNTER — Ambulatory Visit: Payer: BLUE CROSS/BLUE SHIELD | Admitting: Family Medicine

## 2018-01-23 ENCOUNTER — Encounter: Payer: Self-pay | Admitting: Family Medicine

## 2018-01-23 VITALS — BP 121/81 | HR 72 | Temp 98.4°F | Resp 20 | Ht 64.0 in | Wt 165.0 lb

## 2018-01-23 DIAGNOSIS — R591 Generalized enlarged lymph nodes: Secondary | ICD-10-CM | POA: Diagnosis not present

## 2018-01-23 DIAGNOSIS — J029 Acute pharyngitis, unspecified: Secondary | ICD-10-CM

## 2018-01-23 DIAGNOSIS — Z7689 Persons encountering health services in other specified circumstances: Secondary | ICD-10-CM

## 2018-01-23 DIAGNOSIS — N926 Irregular menstruation, unspecified: Secondary | ICD-10-CM

## 2018-01-23 DIAGNOSIS — Z01411 Encounter for gynecological examination (general) (routine) with abnormal findings: Secondary | ICD-10-CM | POA: Insufficient documentation

## 2018-01-23 LAB — POCT RAPID STREP A (OFFICE): RAPID STREP A SCREEN: NEGATIVE

## 2018-01-23 LAB — POCT URINE PREGNANCY: Preg Test, Ur: NEGATIVE

## 2018-01-23 MED ORDER — METHYLPREDNISOLONE ACETATE 80 MG/ML IJ SUSP
80.0000 mg | Freq: Once | INTRAMUSCULAR | Status: AC
  Administered 2018-01-23: 80 mg via INTRAMUSCULAR

## 2018-01-23 MED ORDER — AMOXICILLIN 500 MG PO CAPS: 500.0000 mg | ORAL_CAPSULE | Freq: Three times a day (TID) | ORAL | 0 refills | Status: DC

## 2018-01-23 NOTE — Patient Instructions (Signed)
I have printed amoxicllin for you to start on Saturday if worsening.  We hopefully will call you by Friday afternoon and let you know if throat culture is negative.  If negative--> no abx needed.  If positive--> will start abx.  If worsening over weekend and you did not hear from us--> start abx.   Steroid shot today will help with pain from lymph glands.   Rest, hydrate.  + flonase, mucinex (DM if cough), nettie pot or nasal saline.  Allegra plain is ok.  amoxil prescribed, take until completed (if started).  If cough present it can last up to 6-8 weeks.  F/U 2 weeks of not improved.    Please help us help you:  [facility]. Below you will find basic instructions that you may need to access in the future. Please help us help you by reading the instructions, which cover many of the frequent questions we experience.   Prescription refills and request:  -In order to allow more efficient response time, please call your pharmacy for all refills. They will forward the request electronically to us. This allows for the quickest possible response. Request left on a nurse line can take longer to refill, since these are checked as time allows between office patients and other phone calls.  - refill request can take up to 3-5 working days to complete.  - If request is sent electronically and request is appropiate, it is usually completed in 1-2 business days.  - all patients will need to be seen routinely for all chronic medical conditions requiring prescription medications (see follow-up below). If you are overdue for follow up on your condition, you will be asked to make an appointment and we will call in enough medication to cover you until your appointment (up to 30 days).  - all controlled substances will require a face to face visit to request/refill.  - if you desire your prescriptions to go through a new pharmacy, and have an  active script at original pharmacy, you will need to call your pharmacy and have scripts transferred to new pharmacy. This is completed between the pharmacy locations and not by your provider.    Results: If any images or labs were ordered, it can take up to 1 week to get results depending on the test ordered and the lab/facility running and resulting the test. - Normal or stable results, which do not need further discussion, may be released to your mychart immediately with attached note to you. A call may not be generated for normal results. Please make certain to sign up for mychart. If you have questions on how to activate your mychart you can call the front office.  - If your results need further discussion, our office will attempt to contact you via phone, and if unable to reach you after 2 attempts, we will release your abnormal result to your mychart with instructions.  - All results will be automatically released in mychart after 1 week.  - Your provider will provide you with explanation and instruction on all relevant material in your results. Please keep in mind, results and labs may appear confusing or abnormal to the untrained eye, but it does not mean they are actually abnormal for you personally. If you have any questions about your results that are not covered, or you desire more detailed explanation than what was provided, you should make an appointment with your provider to  do so.   Our office handles many outgoing and incoming calls daily. If we have not contacted you within 1 week about your results, please check your mychart to see if there is a message first and if not, then contact our office.  In helping with this matter, you help decrease call volume, and therefore allow [place] to be able to respond to patients needs more efficiently.   Acute office visits (sick visit):  An acute visit is intended for a new problem and are scheduled in shorter time slots to allow schedule openings for  patients with new problems. This is the appropriate visit to discuss a new problem. In order to provide you with excellent quality medical care with proper time for you to explain your problem, have an exam and receive treatment with instructions, these appointments should be limited to one new problem per visit. If you experience a new problem, in which you desire to be addressed, please make an acute office visit, we save openings on the schedule to accommodate you. Please do not save your new problem for any other type of visit, let us take care of it properly and quickly for you.   Follow up visits:  Depending on your condition(s) your provider will need to see you routinely in order to provide you with quality care and prescribe medication(s). Most chronic conditions (Example: hypertension, Diabetes, depression/anxiety... etc), require visits a couple times a year. Your provider will instruct you on proper follow up for your personal medical conditions and history. Please make certain to make follow up appointments for your condition as instructed. Failing to do so could result in lapse in your medication treatment/refills. If you request a refill, and are overdue to be seen on a condition, we will always provide you with a 30 day script (once) to allow you time to schedule.    Medicare wellness (well visit): - we have a wonderful Nurse Selena Batten), that will meet with you and provide you will yearly medicare wellness visits. These visits should occur yearly (can not be scheduled less than 1 calendar year apart) and cover preventive health, immunizations, advance directives and screenings you are entitled to yearly through your medicare benefits. Do not miss out on your entitled benefits, this is when medicare will pay for these benefits to be ordered for you.  These are strongly encouraged by your provider and is the appropriate type of visit to make certain you are up to date with all preventive health  benefits. If you have not had your medicare wellness exam in the last 12 months, please make certain to schedule one by calling the office and schedule your medicare wellness with Selena Batten as soon as possible.   Yearly physical (well visit):  - Adults are recommended to be seen yearly for physicals. Check with your insurance and date of your last physical, most insurances require one calendar year between physicals. Physicals include all preventive health topics, screenings, medical exam and labs that are appropriate for gender/age and history. You may have fasting labs needed at this visit. This is a well visit (not a sick visit), new problems should not be covered during this visit (see acute visit).  - Pediatric patients are seen more frequently when they are younger. Your provider will advise you on well child visit timing that is appropriate for your their age. - This is not a medicare wellness visit. Medicare wellness exams do not have an exam portion to the visit. Some medicare companies  allow for a physical, some do not allow a yearly physical. If your medicare allows a yearly physical you can schedule the medicare wellness with our nurse Selena Batten and have your physical with your provider after, on the same day. Please check with insurance for your full benefits.   Late Policy/No Shows:  - all new patients should arrive 15-30 minutes earlier than appointment to allow [place] time  to  obtain all personal demographics,  insurance information and for you to complete office paperwork. - All established patients should arrive 10-15 minutes earlier than appointment time to update all information and be checked in .  - In our best efforts to run on time, if you are late for your appointment you will be asked to either reschedule or if able, we will work you back into the schedule. There will be a wait time to work you back in the schedule,  depending on availability.  - If you are unable to make it to your appointment  as scheduled, please call 24 hours ahead of time to allow [place] to fill the time slot with someone else who needs to be seen. If you do not cancel your appointment ahead of time, you may be charged a no show fee.

## 2018-01-23 NOTE — Progress Notes (Signed)
Patient ID: Megan Hammond, female  DOB: 01/06/1996, 22 y.o.   MRN: [id number] Patient Care Team    Relationship Specialty Notifications Start End  Natalia Leatherwood, DO PCP - General Family Medicine  01/23/18     Chief Complaint  Patient presents with  . Establish Care  . Sore Throat    swollen glands    Subjective:  Megan Hammond is a 22 y.o.  female present for new patient establishment. All past medical history, surgical history, allergies, family history, immunizations, medications and social history were obtained and entered  in the electronic medical record today. All recent labs, ED visits and hospitalizations within the last year were reviewed. Patient's last menstrual period was 01/23/2018.   Abnormal menses: Reports she started her birth control pills approximately 4 months ago.  Her cycles were normal up until this month in which she is now on her second cycle 2 weeks apart from each other.  She is sexually active with her female partner, in which she is in a monogamous relationship.  SHe is uncertain if she is ever had a Pap smear.  She denies vaginal discharge or irritation.  She endorses menstrual cramps.  She denies unprotected sex.  Birth control pills [facility].  Pharyngitis:  Pt reports sore throat that started 3 nights ago.  Thought it might have been her allergies so she started to take Claritin-D.  Gargled with salt water to help with symptoms.  She reports her symptoms continue to become worse.  She has swollen glands that are very tender left greater than right.  She has a 21-year-old in daycare.  He was sick last week.  He had some mild chills on Sunday.  Denies nausea, vomit diarrhea or headache.  Depression screen [facility] 2/9 01/08/2018 12/20/2015 08/24/2015  Decreased Interest 2 1 1   Down, Depressed, Hopeless 1 1 1   PHQ - 2 Score 3 2 2   Altered sleeping 1 0 2  Tired, decreased energy 2 2 2   Change in appetite 1 0 2  Feeling bad or failure  about yourself  1 1 1   Trouble concentrating 1 1 1   Moving slowly or fidgety/restless 1 1 0  Suicidal thoughts 0 0 0  PHQ-9 Score 10 7 10   Difficult doing work/chores Somewhat difficult Somewhat difficult Somewhat difficult   GAD 7 : Generalized Anxiety Score 01/08/2018 12/20/2015 08/24/2015  Nervous, Anxious, on Edge 1 2 1   Control/stop worrying 2 0 1  Worry too much - different things 2 2 1   Trouble relaxing 1 1 1   Restless 1 2 1   Easily annoyed or irritable 3 3 2   Afraid - awful might happen 2 1 1   Total GAD 7 Score 12 11 8   Anxiety Difficulty Somewhat difficult Somewhat difficult Somewhat difficult    Current Exercise Habits: Structured exercise class, Time (Minutes): 30, Frequency (Times/Week): 4, Weekly Exercise (Minutes/Week): 120, Intensity: Moderate Exercise limited by: Other - see comments(zumba) No flowsheet data found.   Immunization History  Administered Date(s) Administered  . Influenza-Unspecified 07/23/2015, 06/02/2016, 06/03/2017    No exam data present  Past Medical History:  Diagnosis Date  . Depression    No Known Allergies Past Surgical History:  Procedure Laterality Date  . NO PAST SURGERIES     Family History  Adopted: Yes  Family history unknown: Yes   Social History   Socioeconomic History  . Marital status: Single    Spouse name: Megan Hammond  . Number  of children: 1  . Years of education: Not on file  . Highest education level: Not on file  Occupational History  . Occupation: Archivist  Social Needs  . Financial resource strain: Not on file  . Food insecurity:    Worry: Not on file    Inability: Not on file  . Transportation needs:    Medical: Not on file    Non-medical: Not on file  Tobacco Use  . Smoking status: Never Smoker  . Smokeless tobacco: Never Used  Substance and Sexual Activity  . Alcohol use: Yes    Alcohol/week: 1.2 oz    Types: 2 Glasses of wine per week  . Drug use: No  . Sexual activity: Yes     Partners: Male    Birth control/protection: OCP  Lifestyle  . Physical activity:    Days per week: Not on file    Minutes per session: Not on file  . Stress: Not on file  Relationships  . Social connections:    Talks on phone: Not on file    Gets together: Not on file    Attends religious service: Not on file    Active member of club or organization: Not on file    Attends meetings of clubs or organizations: Not on file    Relationship status: Not on file  . Intimate partner violence:    Fear of current or ex partner: Not on file    Emotionally abused: Not on file    Physically abused: Not on file    Forced sexual activity: Not on file  Other Topics Concern  . Not on file  Social History Narrative   Some college. She currently lives with her boyfriend and son and one dog.    Works as [employer] as a Scientist, clinical (histocompatibility and immunogenetics).    2 caffeinated drinks daily.   Exercises routinely   Smoke alarm in the home, wears her seatbelt   Feels safe in her relationships         Allergies as of 01/23/2018   No Known Allergies     Medication List        Accurate as of 01/23/18  6:09 PM. Always use your most recent med list.          amoxicillin 500 MG capsule Commonly known as:  AMOXIL Take 1 capsule (500 mg total) by mouth 3 (three) times daily.   SRONYX 0.1-20 MG-MCG tablet Generic drug:  levonorgestrel-ethinyl estradiol Take 1 tablet by mouth daily.       All past medical history, surgical history, allergies, family history, immunizations andmedications were updated in the EMR today and reviewed under the history and medication portions of their EMR.     Recent Results (from the past 2160 hour(s))  CBC with Differential/Platelet     Status: None   Collection Time: 01/08/18 11:51 AM  Result Value Ref Range   WBC 8.4 3.8 - 10.8 Thousand/uL   RBC 4.61 3.80 - 5.10 Million/uL   Hemoglobin 13.7 11.7 - 15.5 g/dL   HCT 16.1 09.6 - 04.5 %   MCV 85.5 80.0 - 100.0 fL   MCH 29.7 27.0 - 33.0 pg   MCHC  34.8 32.0 - 36.0 g/dL   RDW 40.9 81.1 - 91.4 %   Platelets 225 140 - 400 Thousand/uL   MPV 11.6 7.5 - 12.5 fL   Neutro Abs 6,174 1,500 - 7,800 cells/uL   Lymphs Abs 1,436 850 - 3,900 cells/uL   WBC mixed  population 680 200 - 950 cells/uL   Eosinophils Absolute 76 15 - 500 cells/uL   Basophils Absolute 34 0 - 200 cells/uL   Neutrophils Relative % 73.5 %   Total Lymphocyte 17.1 %   Monocytes Relative 8.1 %   Eosinophils Relative 0.9 %   Basophils Relative 0.4 %  POCT rapid strep A     Status: Normal   Collection Time: 01/23/18  2:21 PM  Result Value Ref Range   Rapid Strep A Screen Negative Negative  POCT urine pregnancy     Status: Normal   Collection Time: 01/23/18  2:36 PM  Result Value Ref Range   Preg Test, Ur Negative Negative    No results found.   ROS: 14 pt review of systems performed and negative (unless mentioned in an HPI)  Objective: BP 121/81 (BP Location: Right Arm, Patient Position: Sitting, Cuff Size: Normal)   Pulse 72   Temp 98.4 F (36.9 C)   Resp 20   Ht 5\' 4"  (1.626 m)   Wt 165 lb (74.8 kg)   LMP 01/23/2018   SpO2 99%   BMI 28.32 kg/m  Gen: Afebrile. No acute distress. Nontoxic in appearance, well-developed, well-nourished, Caucasian female. HENT: AT. [place]. Bilateral TM visualized and normal in appearance, normal external auditory canal. MMM, no oral lesions, adequate dentition. Bilateral nares with mild erythema, moderate drainage, mild swelling. Throat with erythema and cobblestoning, no ulcerations or exudates.  No cough on exam, mild hoarseness on exam. Eyes:Pupils Equal Round Reactive to light, Extraocular movements intact,  Conjunctiva without redness, discharge or icterus. Neck/lymp/endocrine: Supple, mild bilateral anterior cervical lymphadenopathy CV: RRR  Chest: CTAB, no wheeze, rhonchi or crackles.  Abd: Soft. NTND. BS present.  Skin: no rashes, purpura or petechiae.  Neuro/Msk:  Normal gait. PERLA. EOMi. Alert. Oriented x3.  Psych:  Normal affect, dress and demeanor. Normal speech. Normal thought content and judgment.   Assessment/plan: Dena BilletValeria Colville is a 22 y.o. female present for new pt establishment Sore throat/pharyngitis/lymphadenopathy Rest, hydrate.  + flonase, mucinex (DM if cough) Amoxil prescribed/printed, was encouraged to not start antibiotic unless we call her Friday afternoon with a positive respiratory culture or if respiratory culture is not returned by the weekend and her symptoms are worsening.  As her symptoms are likely viral and need over-the-counter supportive care.  She reports understanding. If cough present it can last up to 6-8 weeks.  F/U 2 weeks of not improved.  - POCT rapid strep A--> negative today - Upper Respiratory Culture sent - methylPREDNISolone acetate (DEPO-MEDROL) injection 80 mg -Follow-up as needed  Irregular menses -Negative urine pregnancy today.  Patient was advised to allow 1-2 more cycles of birth control use to see if any continued breakthrough bleeding occurs.  Strongly encouraged her to have a cervical cancer screening if she has not done so already. -Breakthrough bleeding continues would need to consider different PCP or birth control method. - POCT urine pregnancy -Follow-up as needed, advised her if she has not had her physical yet this calendar year and she desires her Pap to be completed here, she can schedule her physical with Pap and we would complete both on the same day.  Return if symptoms worsen or fail to improve.   Note is dictated utilizing voice recognition software. Although note has been proof read prior to signing, occasional typographical errors still can be missed. If any questions arise, please do not hesitate to call for verification.  Electronically signed by: Felix Pacinienee Mihailo Sage, DO [facility] [place]

## 2018-01-25 LAB — CULTURE, UPPER RESPIRATORY
MICRO NUMBER: 90415176
SPECIMEN QUALITY:: ADEQUATE

## 2018-02-14 ENCOUNTER — Other Ambulatory Visit: Payer: BLUE CROSS/BLUE SHIELD

## 2018-02-22 ENCOUNTER — Ambulatory Visit
Admission: RE | Admit: 2018-02-22 | Discharge: 2018-02-22 | Disposition: A | Discharge status: Home / Self Care | Payer: BLUE CROSS/BLUE SHIELD | Source: Ambulatory Visit | Attending: Physician Assistant | Admitting: Physician Assistant

## 2018-02-22 NOTE — Progress Notes (Signed)
Call pt: normal imaging. Swelling appears to be due to subcutaneous fat. Great news.

## 2018-02-28 ENCOUNTER — Encounter: Payer: BLUE CROSS/BLUE SHIELD | Admitting: Family Medicine

## 2018-02-28 DIAGNOSIS — Z0289 Encounter for other administrative examinations: Secondary | ICD-10-CM

## 2018-03-15 ENCOUNTER — Encounter: Payer: BLUE CROSS/BLUE SHIELD | Admitting: Family Medicine

## 2018-03-22 ENCOUNTER — Ambulatory Visit (INDEPENDENT_AMBULATORY_CARE_PROVIDER_SITE_OTHER): Payer: BLUE CROSS/BLUE SHIELD | Admitting: Family Medicine

## 2018-03-22 ENCOUNTER — Other Ambulatory Visit (HOSPITAL_COMMUNITY)
Admission: RE | Admit: 2018-03-22 | Discharge: 2018-03-22 | Disposition: A | Discharge status: Home / Self Care | Payer: BLUE CROSS/BLUE SHIELD | Source: Ambulatory Visit | Attending: Family Medicine | Admitting: Family Medicine

## 2018-03-22 ENCOUNTER — Encounter: Payer: Self-pay | Admitting: Family Medicine

## 2018-03-22 VITALS — BP 111/64 | HR 73 | Temp 98.0°F | Resp 20 | Ht 64.0 in | Wt 166.5 lb

## 2018-03-22 DIAGNOSIS — Z124 Encounter for screening for malignant neoplasm of cervix: Secondary | ICD-10-CM | POA: Diagnosis not present

## 2018-03-22 DIAGNOSIS — E663 Overweight: Secondary | ICD-10-CM | POA: Insufficient documentation

## 2018-03-22 DIAGNOSIS — Z01411 Encounter for gynecological examination (general) (routine) with abnormal findings: Secondary | ICD-10-CM

## 2018-03-22 DIAGNOSIS — Z3041 Encounter for surveillance of contraceptive pills: Secondary | ICD-10-CM

## 2018-03-22 DIAGNOSIS — Z0000 Encounter for general adult medical examination without abnormal findings: Secondary | ICD-10-CM | POA: Diagnosis not present

## 2018-03-22 MED ORDER — SRONYX 0.1-20 MG-MCG PO TABS: 1.0000 | ORAL_TABLET | Freq: Every day | ORAL | 3 refills | Status: DC

## 2018-03-22 NOTE — Patient Instructions (Signed)

## 2018-03-22 NOTE — Progress Notes (Signed)
Patient ID: Megan Hammond, female  DOB: 05-30-1996, 22 y.o.   MRN: [id number] Patient Care Team    Relationship Specialty Notifications Start End  Natalia Leatherwood, DO PCP - General Family Medicine  01/23/18     Chief Complaint  Patient presents with  . Annual Exam  . Gynecologic Exam    Subjective:  Megan Hammond is a 22 y.o.  Female  present for CPE with pap. All past medical history, surgical history, allergies, family history, immunizations, medications and social history were updated in the electronic medical record today. All recent labs, ED visits and hospitalizations within the last year were reviewed.  Well woman exam: Patient presents today for her well woman exam.  She has been on birth control pills up until about 2 weeks ago, only because she ran out.  She would like refills on these today.  Her menstrual cycles were regular on the birth control pills.  She is a G1P1001.  Patient's last menstrual period was 03/08/2018.  She denies vaginal discharge, dyspareunia or vaginal irritation.  She is sexually active with one female partner.  Health maintenance:  Colonoscopy: Unknown family history, routine screening at 29 Mammogram: None family history, routine screening at 56. SBE performed routinely Cervical cancer screening: Reports of prior Paps, Pap completed today. Immunizations: tdap UTD 2015, Influenza UTD 2018 (encouraged yearly) Infectious disease screening: HIV completed 2015, chlamydia screen 2015 normal.  DEXA: N/A Assistive device: none Oxygen WUJ:WJXB Patient has a Dental home. Hospitalizations/ED visits: reviewed    Depression screen [facility] 2/9 03/22/2018 01/08/2018 12/20/2015 08/24/2015  Decreased Interest 0 2 1 1   Down, Depressed, Hopeless 0 1 1 1   PHQ - 2 Score 0 3 2 2   Altered sleeping - 1 0 2  Tired, decreased energy - 2 2 2   Change in appetite - 1 0 2  Feeling bad or failure about yourself  - 1 1 1   Trouble concentrating - 1 1 1   Moving slowly or  fidgety/restless - 1 1 0  Suicidal thoughts - 0 0 0  PHQ-9 Score - 10 7 10   Difficult doing work/chores - Somewhat difficult Somewhat difficult Somewhat difficult   GAD 7 : Generalized Anxiety Score 01/08/2018 12/20/2015 08/24/2015  Nervous, Anxious, on Edge 1 2 1   Control/stop worrying 2 0 1  Worry too much - different things 2 2 1   Trouble relaxing 1 1 1   Restless 1 2 1   Easily annoyed or irritable 3 3 2   Afraid - awful might happen 2 1 1   Total GAD 7 Score 12 11 8   Anxiety Difficulty Somewhat difficult Somewhat difficult Somewhat difficult     Current Exercise Habits: Structured exercise class, Type of exercise: strength training/weights, Time (Minutes): 45, Frequency (Times/Week): 3, Weekly Exercise (Minutes/Week): 135, Intensity: Moderate Exercise limited by: None identified   Immunization History  Administered Date(s) Administered  . Influenza-Unspecified 07/23/2015, 06/02/2016, 06/03/2017     Past Medical History:  Diagnosis Date  . Depression    No Known Allergies Past Surgical History:  Procedure Laterality Date  . NO PAST SURGERIES     Family History  Adopted: Yes  Family history unknown: Yes   Social History   Socioeconomic History  . Marital status: Single    Spouse name: Megan Hammond  . Number of children: 1  . Years of education: Not on file  . Highest education level: Not on file  Occupational History  . Occupation: Archivist  Social Needs  . Physicist, medical  strain: Not on file  . Food insecurity:    Worry: Not on file    Inability: Not on file  . Transportation needs:    Medical: Not on file    Non-medical: Not on file  Tobacco Use  . Smoking status: Never Smoker  . Smokeless tobacco: Never Used  Substance and Sexual Activity  . Alcohol use: Yes    Alcohol/week: 1.2 oz    Types: 2 Glasses of wine per week  . Drug use: No  . Sexual activity: Yes    Partners: Male    Birth control/protection: OCP  Lifestyle  . Physical  activity:    Days per week: Not on file    Minutes per session: Not on file  . Stress: Not on file  Relationships  . Social connections:    Talks on phone: Not on file    Gets together: Not on file    Attends religious service: Not on file    Active member of club or organization: Not on file    Attends meetings of clubs or organizations: Not on file    Relationship status: Not on file  . Intimate partner violence:    Fear of current or ex partner: Not on file    Emotionally abused: Not on file    Physically abused: Not on file    Forced sexual activity: Not on file  Other Topics Concern  . Not on file  Social History Narrative   Some college. She currently lives with her boyfriend and son and one dog.    Patient was adopted from [place].   Works as [employer] as a Scientist, clinical (histocompatibility and immunogenetics).    2 caffeinated drinks daily.   Exercises routinely   Smoke alarm in the home, wears her seatbelt   Feels safe in her relationships         Allergies as of 03/22/2018   No Known Allergies     Medication List        Accurate as of 03/22/18 12:31 PM. Always use your most recent med list.          SRONYX 0.1-20 MG-MCG tablet Generic drug:  levonorgestrel-ethinyl estradiol Take 1 tablet by mouth daily.       All past medical history, surgical history, allergies, family history, immunizations andmedications were updated in the EMR today and reviewed under the history and medication portions of their EMR.     Recent Results (from the past 2160 hour(s))  CBC with Differential/Platelet     Status: None   Collection Time: 01/08/18 11:51 AM  Result Value Ref Range   WBC 8.4 3.8 - 10.8 Thousand/uL   RBC 4.61 3.80 - 5.10 Million/uL   Hemoglobin 13.7 11.7 - 15.5 g/dL   HCT 16.1 09.6 - 04.5 %   MCV 85.5 80.0 - 100.0 fL   MCH 29.7 27.0 - 33.0 pg   MCHC 34.8 32.0 - 36.0 g/dL   RDW 40.9 81.1 - 91.4 %   Platelets 225 140 - 400 Thousand/uL   MPV 11.6 7.5 - 12.5 fL   Neutro Abs 6,174 1,500 - 7,800 cells/uL    Lymphs Abs 1,436 850 - 3,900 cells/uL   WBC mixed population 680 200 - 950 cells/uL   Eosinophils Absolute 76 15 - 500 cells/uL   Basophils Absolute 34 0 - 200 cells/uL   Neutrophils Relative % 73.5 %   Total Lymphocyte 17.1 %   Monocytes Relative 8.1 %   Eosinophils Relative 0.9 %  Basophils Relative 0.4 %  POCT rapid strep A     Status: Normal   Collection Time: 01/23/18  2:21 PM  Result Value Ref Range   Rapid Strep A Screen Negative Negative  POCT urine pregnancy     Status: Normal   Collection Time: 01/23/18  2:36 PM  Result Value Ref Range   Preg Test, Ur Negative Negative  Upper Respiratory Culture     Status: Abnormal   Collection Time: 01/23/18  2:41 PM  Result Value Ref Range   MICRO NUMBER: [id number]    SPECIMEN QUALITY: ADEQUATE    Source NOT GIVEN    STATUS: FINAL    ISOLATE 1: Streptococcus pyogenes (A)     Comment: Heavy growth of Group A Streptococcus isolated Beta-hemolytic Streptococci are predictably susceptible to penicillin and other beta-lactams. Susceptibility testing not routinely performed.    [place] Axilla Right  Result Date: 02/22/2018 CLINICAL DATA:  Patient presents with right axillary region swelling noted for the last several weeks. No discrete reported lump. EXAM: ULTRASOUND OF THE RIGHT AXILLA COMPARISON:  Previous exam(s). FINDINGS: On physical exam, there is soft tissue prominence of the right axilla, which is soft palpation. No underlying mass. Targeted ultrasound is performed, showing normal tissue with no masses, areas of abnormal echogenicity or enlarged or abnormal lymph nodes. IMPRESSION: 1. No imaging abnormality. 2. The axillary swelling appears to be due to prominent subcutaneous fat. RECOMMENDATION: 1. Screening mammogram at age 34 unless there are persistent or intervening clinical concerns. (Code:SM-B-40A) 2. The axillary swelling continues to increase, consider follow-up imaging with MRI with without contrast for further assessment. I have  discussed the findings and recommendations with the patient. Results were also provided in writing at the conclusion of the visit. If applicable, a reminder letter will be sent to the patient regarding the next appointment. BI-RADS CATEGORY  1: Negative. Electronically Signed   By: Amie Portland M.D.   On: 02/22/2018 12:48     ROS: 14 pt review of systems performed and negative (unless mentioned in an HPI)  Objective: BP 111/64 (BP Location: Right Arm, Patient Position: Sitting, Cuff Size: Normal)   Pulse 73   Temp 98 F (36.7 C)   Resp 20   Ht 5\' 4"  (1.626 m)   Wt 166 lb 8 oz (75.5 kg)   LMP 03/08/2018   SpO2 97%   BMI 28.58 kg/m  Gen: Afebrile. No acute distress. Nontoxic in appearance, well-developed, well-nourished, pleasant, Caucasian female. HENT: AT. [place]. Bilateral TM visualized and normal in appearance, normal external auditory canal. MMM, no oral lesions, adequate dentition. Bilateral nares within normal limits. Throat without erythema, ulcerations or exudates. no Cough on exam, no hoarseness on exam. Eyes:Pupils Equal Round Reactive to light, Extraocular movements intact,  Conjunctiva without redness, discharge or icterus. Neck/lymp/endocrine: Supple,no lymphadenopathy, no thyromegaly CV: RRR no murmur, no edema, +2/4 P posterior tibialis pulses.  Chest: CTAB, no wheeze, rhonchi or crackles.  Normal respiratory effort.  Good air movement. Abd: Soft.  Round. ND.  Very mild discomfort left of umbilicus. BS present.  No masses palpated. No hepatosplenomegaly. No rebound tenderness or guarding. Skin: No rashes, purpura or petechiae. Warm and well-perfused. Skin intact. Neuro/Msk:  Normal gait. PERLA. EOMi. Alert. Oriented x3.  Cranial nerves II through XII intact. Muscle strength 5/5 upper/lower extremity. DTRs equal bilaterally. Psych: Normal affect, dress and demeanor. Normal speech. Normal thought content and judgment. Breasts: breasts appear normal, symmetrical, no tenderness on  exam, no suspicious masses, no skin or  nipple changes or axillary nodes. GYN:  External genitalia within normal limits, normal hair distribution, no lesions. Urethral meatus normal, no lesions. Vaginal mucosa pink, moist, normal rugae, no lesions. No cystocele or rectocele. cervix without lesions, white creamy discharge. Bimanual exam revealed normal uterus.  No bladder/suprapubic fullness, masses or tenderness. No cervical motion tenderness. No adnexal fullness. Anus and perineum within normal limits, no lesions.  No exam data present  Assessment/plan: Dena BilletValeria Mcmurphy is a 22 y.o. female present for CPE with PAP. Encounter for preventive health examination Patient was encouraged to exercise greater than 150 minutes a week. Patient was encouraged to choose a diet filled with fresh fruits and vegetables, and lean meats. AVS provided to patient today for education/recommendation on gender specific health and safety maintenance. Colonoscopy: Unknown family history, routine screening at 3250 Mammogram: None family history, routine screening at 940. SBE performed routinely Cervical cancer screening: Reports of prior Paps, Pap completed today. Immunizations: tdap UTD 2015, Influenza UTD 2018 (encouraged yearly) Infectious disease screening: HIV completed 2015, chlamydia screen 2015 normal.  DEXA: N/A Pap smear for cervical cancer screening/Encounter for gynecological examination (general) (routine) with abnormal findings - Cytology - PAP -Mild discharge on exam, symptom medic.  Will await results and provide Diflucan if needed. Surveillance for birth control, oral contraceptives Refills provided today for 1 year.  Follow-up yearly at her physicals for refills. Overweight (BMI 25.0-29.9) Routine exercise recommended.  Return in about 1 year (around 03/23/2019) for CPE.  Electronically signed by: Felix Pacinienee Kuneff, DO [facility] [place]

## 2018-03-24 ENCOUNTER — Encounter: Payer: Self-pay | Admitting: Family Medicine

## 2018-03-25 ENCOUNTER — Telehealth: Payer: Self-pay | Admitting: Family Medicine

## 2018-03-25 DIAGNOSIS — E663 Overweight: Secondary | ICD-10-CM

## 2018-03-25 NOTE — Telephone Encounter (Signed)
Spoke with patient reviewed information if her insurance does not cover she will call us back to let us know.

## 2018-03-25 NOTE — Telephone Encounter (Signed)
In response to patient's ED chart message for nutrition referral. I have placed the referral for her.  However she should check with her insurance to see if it is covered given the only diagnostic code that can be used for her is BMI 25-29.9. If it does not cover nutrition, we can attempt to provide her more guidance here if desired.

## 2018-03-26 ENCOUNTER — Telehealth: Payer: Self-pay | Admitting: Family Medicine

## 2018-03-26 LAB — CYTOLOGY - PAP
CHLAMYDIA, DNA PROBE: NEGATIVE
Candida vaginitis: POSITIVE — AB
DIAGNOSIS: NEGATIVE

## 2018-03-26 MED ORDER — FLUCONAZOLE 150 MG PO TABS: 150.0000 mg | ORAL_TABLET | Freq: Once | ORAL | 0 refills | Status: AC

## 2018-03-26 NOTE — Telephone Encounter (Signed)
Please inform patient the following information: Her pap is normal. Routine rpt every 2 years for her age.  - She did have evidence of yeast infection on PAP and diflucan x1 tab as been called in for her.

## 2018-03-27 NOTE — Telephone Encounter (Signed)
Spoke with patient reviewed lab results and instructions. Patient verbalized understanding. 

## 2018-06-12 ENCOUNTER — Encounter (INDEPENDENT_AMBULATORY_CARE_PROVIDER_SITE_OTHER): Payer: Self-pay

## 2018-08-30 ENCOUNTER — Ambulatory Visit: Payer: BLUE CROSS/BLUE SHIELD | Admitting: Family Medicine

## 2018-08-30 ENCOUNTER — Encounter: Payer: Self-pay | Admitting: Family Medicine

## 2018-08-30 VITALS — BP 116/79 | HR 65 | Temp 98.5°F | Resp 20 | Ht 64.0 in | Wt 166.0 lb

## 2018-08-30 DIAGNOSIS — F419 Anxiety disorder, unspecified: Secondary | ICD-10-CM

## 2018-08-30 DIAGNOSIS — F329 Major depressive disorder, single episode, unspecified: Secondary | ICD-10-CM | POA: Diagnosis not present

## 2018-08-30 DIAGNOSIS — F32A Depression, unspecified: Secondary | ICD-10-CM

## 2018-08-30 MED ORDER — VENLAFAXINE HCL ER 37.5 MG PO CP24: ORAL_CAPSULE | ORAL | 0 refills | Status: DC

## 2018-08-30 NOTE — Patient Instructions (Addendum)
Start effexor taper 1 pill for 7 days, then 2 pills for the rest of script. Follow up first week in December.   Please help [place] help you:  We are honored you have chosen [facility] for your Primary Care home. Below you will find basic instructions that you may need to access in the future. Please help [place] help you by reading the instructions, which cover many of the frequent questions we experience.   Prescription refills and request:  -In order to allow more efficient response time, please call your pharmacy for all refills. They will forward the request electronically to [place]. This allows for the quickest possible response. Request left on a nurse line can take longer to refill, since these are checked as time allows between office patients and other phone calls.  - refill request can take up to 3-5 working days to complete.  - If request is sent electronically and request is appropiate, it is usually completed in 1-2 business days.  - all patients will need to be seen routinely for all chronic medical conditions requiring prescription medications (see follow-up below). If you are overdue for follow up on your condition, you will be asked to make an appointment and we will call in enough medication to cover you until your appointment (up to 30 days).  - all controlled substances will require a face to face visit to request/refill.  - if you desire your prescriptions to go through a new pharmacy, and have an active script at original pharmacy, you will need to call your pharmacy and have scripts transferred to new pharmacy. This is completed between the pharmacy locations and not by your provider.    Results: If any images or labs were ordered, it can take up to 1 week to get results depending on the test ordered and the lab/facility running and resulting the test. - Normal or stable results, which do not need further discussion, may be released to your mychart immediately with attached note to  you. A call may not be generated for normal results. Please make certain to sign up for mychart. If you have questions on how to activate your mychart you can call the front office.  - If your results need further discussion, our office will attempt to contact you via phone, and if unable to reach you after 2 attempts, we will release your abnormal result to your mychart with instructions.  - All results will be automatically released in mychart after 1 week.  - Your provider will provide you with explanation and instruction on all relevant material in your results. Please keep in mind, results and labs may appear confusing or abnormal to the untrained eye, but it does not mean they are actually abnormal for you personally. If you have any questions about your results that are not covered, or you desire more detailed explanation than what was provided, you should make an appointment with your provider to do so.   Our office handles many outgoing and incoming calls daily. If we have not contacted you within 1 week about your results, please check your mychart to see if there is a message first and if not, then contact our office.  In helping with this matter, you help decrease call volume, and therefore allow [place] to be able to respond to patients needs more efficiently.   Acute office visits (sick visit):  An acute visit is intended for a new problem and are scheduled in shorter time slots to  allow schedule openings for patients with new problems. This is the appropriate visit to discuss a new problem. Problems will not be addressed by phone call or Echart message. Appointment is needed if requesting treatment. In order to provide you with excellent quality medical care with proper time for you to explain your problem, have an exam and receive treatment with instructions, these appointments should be limited to one new problem per visit. If you experience a new problem, in which you desire to be addressed,  please make an acute office visit, we save openings on the schedule to accommodate you. Please do not save your new problem for any other type of visit, let us take care of it properly and quickly for you.   Follow up visits:  Depending on your condition(s) your provider will need to see you routinely in order to provide you with quality care and prescribe medication(s). Most chronic conditions (Example: hypertension, Diabetes, depression/anxiety... etc), require visits a couple times a year. Your provider will instruct you on proper follow up for your personal medical conditions and history. Please make certain to make follow up appointments for your condition as instructed. Failing to do so could result in lapse in your medication treatment/refills. If you request a refill, and are overdue to be seen on a condition, we will always provide you with a 30 day script (once) to allow you time to schedule.    Medicare wellness (well visit): - we have a wonderful Nurse Maudie Mercury), that will meet with you and provide you will yearly medicare wellness visits. These visits should occur yearly (can not be scheduled less than 1 calendar year apart) and cover preventive health, immunizations, advance directives and screenings you are entitled to yearly through your medicare benefits. Do not miss out on your entitled benefits, this is when medicare will pay for these benefits to be ordered for you.  These are strongly encouraged by your provider and is the appropriate type of visit to make certain you are up to date with all preventive health benefits. If you have not had your medicare wellness exam in the last 12 months, please make certain to schedule one by calling the office and schedule your medicare wellness with Maudie Mercury as soon as possible.   Yearly physical (well visit):  - Adults are recommended to be seen yearly for physicals. Check with your insurance and date of your last physical, most insurances require one  calendar year between physicals. Physicals include all preventive health topics, screenings, medical exam and labs that are appropriate for gender/age and history. You may have fasting labs needed at this visit. This is a well visit (not a sick visit), new problems should not be covered during this visit (see acute visit).  - Pediatric patients are seen more frequently when they are younger. Your provider will advise you on well child visit timing that is appropriate for your their age. - This is not a medicare wellness visit. Medicare wellness exams do not have an exam portion to the visit. Some medicare companies allow for a physical, some do not allow a yearly physical. If your medicare allows a yearly physical you can schedule the medicare wellness with our nurse Maudie Mercury and have your physical with your provider after, on the same day. Please check with insurance for your full benefits.   Late Policy/No Shows:  - all new patients should arrive 15-30 minutes earlier than appointment to allow [place] time  to  obtain all personal demographics,  insurance information and for you to complete office paperwork. - All established patients should arrive 10-15 minutes earlier than appointment time to update all information and be checked in .  - In our best efforts to run on time, if you are late for your appointment you will be asked to either reschedule or if able, we will work you back into the schedule. There will be a wait time to work you back in the schedule,  depending on availability.  - If you are unable to make it to your appointment as scheduled, please call 24 hours ahead of time to allow [place] to fill the time slot with someone else who needs to be seen. If you do not cancel your appointment ahead of time, you may be charged a no show fee.

## 2018-08-30 NOTE — Progress Notes (Signed)
Megan Hammond , Oct 28, 1995, 22 y.o., female MRN: [id number] Patient Care Team    Relationship Specialty Notifications Start End  Natalia Leatherwood, DO PCP - General Family Medicine  01/23/18     Chief Complaint  Patient presents with  . Anxiety    moody,angry alot  . Depression     Subjective: Pt presents for an OV with complaints of being more irritability and feeling angry. She reports she noticed it has increased the last few years. At one time she spoke with her prior doctor about it and was tried on Wellbutrin and prozac (60 mg QD). She states she reports today she never really took that medicine like she was instructed. She only took intermittently for 4 weeks. She reports she wants to be the best person she can be for her child.  Mood disorder questionarre: negative today.   Depression screen [facility] 2/9 08/30/2018 03/22/2018 01/08/2018 12/20/2015 08/24/2015  Decreased Interest 2 0 2 1 1   Down, Depressed, Hopeless 2 0 1 1 1   PHQ - 2 Score 4 0 3 2 2   Altered sleeping 1 - 1 0 2  Tired, decreased energy 3 - 2 2 2   Change in appetite 0 - 1 0 2  Feeling bad or failure about yourself  1 - 1 1 1   Trouble concentrating 2 - 1 1 1   Moving slowly or fidgety/restless 1 - 1 1 0  Suicidal thoughts 0 - 0 0 0  PHQ-9 Score 12 - 10 7 10   Difficult doing work/chores Very difficult - Somewhat difficult Somewhat difficult Somewhat difficult   GAD 7 : Generalized Anxiety Score 08/30/2018 01/08/2018 12/20/2015 08/24/2015  Nervous, Anxious, on Edge 3 1 2 1   Control/stop worrying 3 2 0 1  Worry too much - different things 3 2 2 1   Trouble relaxing 3 1 1 1   Restless 1 1 2 1   Easily annoyed or irritable 3 3 3 2   Afraid - awful might happen 3 2 1 1   Total GAD 7 Score 19 12 11 8   Anxiety Difficulty Very difficult Somewhat difficult Somewhat difficult Somewhat difficult    No Known Allergies Social History   Tobacco Use  . Smoking status: Never Smoker  . Smokeless tobacco: Never Used  Substance Use  Topics  . Alcohol use: Yes    Alcohol/week: 2.0 standard drinks    Types: 2 Glasses of wine per week   Past Medical History:  Diagnosis Date  . Depression    Past Surgical History:  Procedure Laterality Date  . NO PAST SURGERIES     Family History  Adopted: Yes  Family history unknown: Yes   Allergies as of 08/30/2018   No Known Allergies     Medication List        Accurate as of 08/30/18 11:14 AM. Always use your most recent med list.          SRONYX 0.1-20 MG-MCG tablet Generic drug:  levonorgestrel-ethinyl estradiol Take 1 tablet by mouth daily.   venlafaxine XR 37.5 MG 24 hr capsule Commonly known as:  EFFEXOR-XR 37.5 mg (1 tab) QD for 7 days, then 75 mg (t tabs) in the morning       All past medical history, surgical history, allergies, family history, immunizations andmedications were updated in the EMR today and reviewed under the history and medication portions of their EMR.     ROS: Negative, with the exception of above mentioned in HPI   Objective:  BP 116/79 (  BP Location: Right Arm, Patient Position: Sitting, Cuff Size: Normal)   Pulse 65   Temp 98.5 F (36.9 C)   Resp 20   Ht 5\' 4"  (1.626 m)   Wt 166 lb (75.3 kg)   LMP 08/13/2018   SpO2 98%   BMI 28.49 kg/m  Body mass index is 28.49 kg/m. Gen: Afebrile. No acute distress. Nontoxic in appearance, well developed, well nourished.  HENT: AT. [place].MMM Eyes:Pupils Equal Round Reactive to light, Extraocular movements intact,  Conjunctiva without redness, discharge or icterus. CV: RRR no murmur Neuro:  Normal gait. PERLA. EOMi. Alert. Oriented x3  Psych: Normal affect, dress and demeanor. Normal speech. Normal thought content and judgment.  No exam data present No results found. No results found for this or any previous visit (from the past 24 hour(s)).  Assessment/Plan: Megan Hammond is a 22 y.o. female present for OV for  Anxiety and depression GAD and PHQ9 assessments for both anxiety and  depression are significant today. - discussed different options including counseling. She is agreeable to start effexor taper to 75 mg QD. Pt is aware to never stop medication abruptly an dit must be taken daily.  She declines counseling at this time, but will think about it. Will be happy to place if she changes her mind.  - f/U 3.5 weeks to recheck after med start and then q 6 mos if doing well.    Reviewed expectations re: course of current medical issues.  Discussed self-management of symptoms.  Outlined signs and symptoms indicating need for more acute intervention.  Patient verbalized understanding and all questions were answered.  Patient received an After-Visit Summary.    No orders of the defined types were placed in this encounter.    Note is dictated utilizing voice recognition software. Although note has been proof read prior to signing, occasional typographical errors still can be missed. If any questions arise, please do not hesitate to call for verification.   electronically signed by:  Felix Pacini, DO  [facility] [place]

## 2018-10-02 ENCOUNTER — Ambulatory Visit: Payer: BLUE CROSS/BLUE SHIELD | Admitting: Family Medicine

## 2018-10-04 ENCOUNTER — Ambulatory Visit: Payer: BLUE CROSS/BLUE SHIELD | Admitting: Family Medicine

## 2018-10-08 ENCOUNTER — Ambulatory Visit (INDEPENDENT_AMBULATORY_CARE_PROVIDER_SITE_OTHER): Payer: BLUE CROSS/BLUE SHIELD | Admitting: Family Medicine

## 2018-10-08 ENCOUNTER — Encounter: Payer: Self-pay | Admitting: Family Medicine

## 2018-10-08 VITALS — BP 115/81 | HR 72 | Temp 98.5°F | Resp 16 | Ht 64.0 in | Wt 161.0 lb

## 2018-10-08 DIAGNOSIS — F418 Other specified anxiety disorders: Secondary | ICD-10-CM | POA: Diagnosis not present

## 2018-10-08 MED ORDER — VENLAFAXINE HCL ER 75 MG PO CP24: 75.0000 mg | ORAL_CAPSULE | Freq: Every day | ORAL | 1 refills | Status: DC

## 2018-10-08 NOTE — Patient Instructions (Signed)
I am glad you are doing well. Follow up in 6 months. Sooner if needed.   Please help us help you:  [facility]. Below you will find basic instructions that you may need to access in the future. Please help us help you by reading the instructions, which cover many of the frequent questions we experience.   Prescription refills and request:  -In order to allow more efficient response time, please call your pharmacy for all refills. They will forward the request electronically to us. This allows for the quickest possible response. Request left on a nurse line can take longer to refill, since these are checked as time allows between office patients and other phone calls.  - refill request can take up to 3-5 working days to complete.  - If request is sent electronically and request is appropiate, it is usually completed in 1-2 business days.  - all patients will need to be seen routinely for all chronic medical conditions requiring prescription medications (see follow-up below). If you are overdue for follow up on your condition, you will be asked to make an appointment and we will call in enough medication to cover you until your appointment (up to 30 days).  - all controlled substances will require a face to face visit to request/refill.  - if you desire your prescriptions to go through a new pharmacy, and have an active script at original pharmacy, you will need to call your pharmacy and have scripts transferred to new pharmacy. This is completed between the pharmacy locations and not by your provider.    Results: If any images or labs were ordered, it can take up to 1 week to get results depending on the test ordered and the lab/facility running and resulting the test. - Normal or stable results, which do not need further discussion, may be released to your mychart immediately with attached note to you. A call may not be generated for  normal results. Please make certain to sign up for mychart. If you have questions on how to activate your mychart you can call the front office.  - If your results need further discussion, our office will attempt to contact you via phone, and if unable to reach you after 2 attempts, we will release your abnormal result to your mychart with instructions.  - All results will be automatically released in mychart after 1 week.  - Your provider will provide you with explanation and instruction on all relevant material in your results. Please keep in mind, results and labs may appear confusing or abnormal to the untrained eye, but it does not mean they are actually abnormal for you personally. If you have any questions about your results that are not covered, or you desire more detailed explanation than what was provided, you should make an appointment with your provider to do so.   Our office handles many outgoing and incoming calls daily. If we have not contacted you within 1 week about your results, please check your mychart to see if there is a message first and if not, then contact our office.  In helping with this matter, you help decrease call volume, and therefore allow us to be able to respond to patients needs more efficiently.   Acute office visits (sick visit):  An acute visit is intended for a new problem and are scheduled in shorter time slots to allow schedule openings for patients with new  problems. This is the appropriate visit to discuss a new problem. Problems will not be addressed by phone call or Echart message. Appointment is needed if requesting treatment. In order to provide you with excellent quality medical care with proper time for you to explain your problem, have an exam and receive treatment with instructions, these appointments should be limited to one new problem per visit. If you experience a new problem, in which you desire to be addressed, please make an acute office visit, we  save openings on the schedule to accommodate you. Please do not save your new problem for any other type of visit, let us take care of it properly and quickly for you.   Follow up visits:  Depending on your condition(s) your provider will need to see you routinely in order to provide you with quality care and prescribe medication(s). Most chronic conditions (Example: hypertension, Diabetes, depression/anxiety... etc), require visits a couple times a year. Your provider will instruct you on proper follow up for your personal medical conditions and history. Please make certain to make follow up appointments for your condition as instructed. Failing to do so could result in lapse in your medication treatment/refills. If you request a refill, and are overdue to be seen on a condition, we will always provide you with a 30 day script (once) to allow you time to schedule.    Medicare wellness (well visit): - we have a wonderful Nurse Selena Batten), that will meet with you and provide you will yearly medicare wellness visits. These visits should occur yearly (can not be scheduled less than 1 calendar year apart) and cover preventive health, immunizations, advance directives and screenings you are entitled to yearly through your medicare benefits. Do not miss out on your entitled benefits, this is when medicare will pay for these benefits to be ordered for you.  These are strongly encouraged by your provider and is the appropriate type of visit to make certain you are up to date with all preventive health benefits. If you have not had your medicare wellness exam in the last 12 months, please make certain to schedule one by calling the office and schedule your medicare wellness with Selena Batten as soon as possible.   Yearly physical (well visit):  - Adults are recommended to be seen yearly for physicals. Check with your insurance and date of your last physical, most insurances require one calendar year between physicals. Physicals  include all preventive health topics, screenings, medical exam and labs that are appropriate for gender/age and history. You may have fasting labs needed at this visit. This is a well visit (not a sick visit), new problems should not be covered during this visit (see acute visit).  - Pediatric patients are seen more frequently when they are younger. Your provider will advise you on well child visit timing that is appropriate for your their age. - This is not a medicare wellness visit. Medicare wellness exams do not have an exam portion to the visit. Some medicare companies allow for a physical, some do not allow a yearly physical. If your medicare allows a yearly physical you can schedule the medicare wellness with our nurse Selena Batten and have your physical with your provider after, on the same day. Please check with insurance for your full benefits.   Late Policy/No Shows:  - all new patients should arrive 15-30 minutes earlier than appointment to allow [place] time  to  obtain all personal demographics,  insurance information and for you to complete  office paperwork. - All established patients should arrive 10-15 minutes earlier than appointment time to update all information and be checked in .  - In our best efforts to run on time, if you are late for your appointment you will be asked to either reschedule or if able, we will work you back into the schedule. There will be a wait time to work you back in the schedule,  depending on availability.  - If you are unable to make it to your appointment as scheduled, please call 24 hours ahead of time to allow [place] to fill the time slot with someone else who needs to be seen. If you do not cancel your appointment ahead of time, you may be charged a no show fee.

## 2018-10-08 NOTE — Progress Notes (Signed)
Megan BilletValeria Hammond , [birth year], 22 y.o., female MRN: [id number] Patient Care Team    Relationship Specialty Notifications Start End  Natalia LeatherwoodKuneff, Renee A, DO PCP - General Family Medicine  01/23/18     Chief Complaint  Patient presents with  . Anxiety    follow up     Subjective:  Pt reports she is doing very well on medication. She is having trouble sleeping soundly since starting med. Right now, she would like to continue medicine .    Prior note:  Pt presents for an OV with complaints of being more irritability and feeling angry. She reports she noticed it has increased the last few years. At one time she spoke with her prior doctor about it and was tried on Wellbutrin and prozac (60 mg QD). She states she reports today she never really took that medicine like she was instructed. She only took intermittently for 4 weeks. She reports she wants to be the best person she can be for her child.  Mood disorder questionarre: negative today.   [facility] 10/08/2018 08/30/2018 03/22/2018 01/08/2018 12/20/2015  Decreased Interest 0 2 0 2 1  Down, Depressed, Hopeless 0 2 0 1 1  PHQ - 2 Score 0 4 0 3 2  Altered sleeping 3 1 - 1 0  Tired, decreased energy 1 3 - 2 2  Change in appetite 0 0 - 1 0  Feeling bad or failure about yourself  0 1 - 1 1  Trouble concentrating 1 2 - 1 1  Moving slowly or fidgety/restless - 1 - 1 1  Suicidal thoughts 0 0 - 0 0  PHQ-9 Score 5 12 - 10 7  Difficult doing work/chores - Very difficult - Somewhat difficult Somewhat difficult   GAD 7 : Generalized Anxiety Score 10/08/2018 08/30/2018 01/08/2018 12/20/2015  Nervous, Anxious, on Edge 1 3 1 2   Control/stop worrying 1 3 2  0  Worry too much - different things 1 3 2 2   Trouble relaxing 1 3 1 1   Restless 0 1 1 2   Easily annoyed or irritable 1 3 3 3   Afraid - awful might happen 1 3 2 1   Total GAD 7 Score 6 19 12 11   Anxiety Difficulty Somewhat difficult Very difficult Somewhat difficult Somewhat difficult     No Known Allergies Social History   Tobacco Use  . Smoking status: Never Smoker  . Smokeless tobacco: Never Used  Substance Use Topics  . Alcohol use: Yes    Alcohol/week: 2.0 standard drinks    Types: 2 Glasses of wine per week   Past Medical History:  Diagnosis Date  . Depression    Past Surgical History:  Procedure Laterality Date  . NO PAST SURGERIES     Family History  Adopted: Yes  Family history unknown: Yes   Allergies as of 10/08/2018   No Known Allergies     Medication List       Accurate as of October 08, 2018  3:04 PM. Always use your most recent med list.        SRONYX 0.1-20 MG-MCG tablet Generic drug:  levonorgestrel-ethinyl estradiol Take 1 tablet by mouth daily.   venlafaxine XR 37.5 MG 24 hr capsule Commonly known as:  EFFEXOR XR 37.5 mg (1 tab) QD for 7 days, then 75 mg (t tabs) in the morning       All past medical history, surgical history, allergies, family history, immunizations andmedications were updated in the EMR today  and reviewed under the history and medication portions of their EMR.     ROS: Negative, with the exception of above mentioned in HPI   Objective:  BP 115/81 (BP Location: Right Arm, Patient Position: Sitting, Cuff Size: Normal)   Pulse 72   Temp 98.5 F (36.9 C) (Oral)   Resp 16   Ht 5\' 4"  (1.626 m)   Wt 161 lb (73 kg)   SpO2 98%   BMI 27.64 kg/m  Body mass index is 27.64 kg/m. Gen: Afebrile. No acute distress.  HENT: AT. [place].  MMM.  CV: RRR  Neuro: Normal gait. PERLA. EOMi. Alert. Oriented x3  Psych: Normal affect, dress and demeanor. Normal speech. Normal thought content and judgment.   No exam data present No results found. No results found for this or any previous visit (from the past 24 hour(s)).  Assessment/Plan: Megan Hammond is a 22 y.o. female present for OV for  Anxiety and depression GAD and PHQ9 assessments both greatly improved today. Pt tolerating med.  Pt is aware to never stop  medication abruptly an dit must be taken daily.  She declines counseling at this time, but will think about it. Will be happy to place if she changes her mind.  Restlessness at night might subside once dose levels out. Could also try melatonin if desired.  F/u q 6 months   Reviewed expectations re: course of current medical issues.  Discussed self-management of symptoms.  Outlined signs and symptoms indicating need for more acute intervention.  Patient verbalized understanding and all questions were answered.  Patient received an After-Visit Summary.   > 15 minutes spent with patient, >50% of time spent face to face    No orders of the defined types were placed in this encounter.    Note is dictated utilizing voice recognition software. Although note has been proof read prior to signing, occasional typographical errors still can be missed. If any questions arise, please do not hesitate to call for verification.   electronically signed by:  Felix Pacini, DO   Primary Care - [place]

## 2019-01-22 ENCOUNTER — Other Ambulatory Visit: Payer: Self-pay | Admitting: Family Medicine

## 2020-10-28 ENCOUNTER — Emergency Department: Admit: 2020-10-28 | Discharge status: Absent without leave | Payer: Self-pay

## 2022-08-14 ENCOUNTER — Ambulatory Visit (INDEPENDENT_AMBULATORY_CARE_PROVIDER_SITE_OTHER): Payer: No Typology Code available for payment source | Admitting: Obstetrics & Gynecology

## 2022-08-14 ENCOUNTER — Encounter: Payer: Self-pay | Admitting: Obstetrics & Gynecology

## 2022-08-14 VITALS — BP 114/76 | HR 71 | Resp 16 | Ht 64.0 in | Wt 165.0 lb

## 2022-08-14 DIAGNOSIS — N941 Unspecified dyspareunia: Secondary | ICD-10-CM | POA: Diagnosis not present

## 2022-08-14 DIAGNOSIS — R102 Pelvic and perineal pain: Secondary | ICD-10-CM

## 2022-08-14 NOTE — Progress Notes (Signed)
Pain since 2017 worse with intercourse

## 2022-08-14 NOTE — Progress Notes (Addendum)
   Subjective:    Patient ID: Megan Hammond, female    DOB: Aug 04, 1996, 26 y.o.   MRN: [id number]  HPI 26 yo female presents for 5 years of pelvic pain.  It started deep and had a work up with TVUS which was negative .  Pain would wax and wane.  [facility]  It showed a 7 cm benign cyst.  She had 1 episode of severe pain that put her in the fetal position.  When she had the follow-up ultrasound on April 26, 2021, she is reported that she had a normal ultrasound.  I cannot see the result of this ultrasound in care everywhere.  Since then she still has intermittent pain.  It is not with insertion it is with penetration like he is hitting a pocket. Menstration makes it worse.  And bleedng can have clots.  Periods are regular within a few days of each other--lasts 4 days.  Patient has a bowel movement every other day, it is hard and has minimal straining associated with it.  She denies nausea vomiting diarrhea and problems with urination.  Niomi has NOT been exposed to sexual transmitted diseases.   Review of Systems  Constitutional: Negative.   Respiratory: Negative.    Cardiovascular: Negative.   Gastrointestinal: Negative.   Genitourinary: Negative.        Objective:   Physical Exam Vitals reviewed.  Constitutional:      General: She is not in acute distress.    Appearance: She is well-developed.  HENT:     Head: Normocephalic and atraumatic.  Eyes:     Conjunctiva/sclera: Conjunctivae normal.  Cardiovascular:     Rate and Rhythm: Normal rate.  Pulmonary:     Effort: Pulmonary effort is normal.  Genitourinary:    Comments: Tanner V Vulva:  No lesion Vagina:  Pink, no lesions, no discharge, no blood Cervix:  No CMT Uterus:  mildly tender mobile Right adnexa--mildly tender, no mass Left adnexa--mildly tender, no mass   Skin:    General: Skin is warm and dry.  Neurological:     Mental Status: She is alert and oriented to person, place, and time.   Psychiatric:        Mood and Affect: Mood normal.    Vitals:   08/14/22 1517  BP: 114/76  Pulse: 71  Resp: 16  Weight: 165 lb (74.8 kg)  Height: 5\' 4"  (1.626 m)       Assessment & Plan:  26 year old female with chronic pelvic pain with intercourse. Pain pelvic ultrasound complete with transvaginal component If there is no GYN pathology, we will move to CT scan. Patient encouraged to take docusate sodium or MiraLAX to improve her bowel regimen. Turn in 2 weeks after ultrasound for her annual exam as well as reviewed the ultrasound and make plan.  30 minutes was spent during this encounter including review of internal and external records, history and physical, coordination of care, and counseling.

## 2022-08-15 ENCOUNTER — Ambulatory Visit (INDEPENDENT_AMBULATORY_CARE_PROVIDER_SITE_OTHER): Payer: No Typology Code available for payment source

## 2022-08-15 DIAGNOSIS — R102 Pelvic and perineal pain: Secondary | ICD-10-CM

## 2022-08-28 ENCOUNTER — Ambulatory Visit: Payer: No Typology Code available for payment source | Admitting: Obstetrics & Gynecology

## 2022-09-04 ENCOUNTER — Encounter: Payer: Self-pay | Admitting: Obstetrics & Gynecology

## 2022-09-04 ENCOUNTER — Ambulatory Visit (INDEPENDENT_AMBULATORY_CARE_PROVIDER_SITE_OTHER): Payer: No Typology Code available for payment source | Admitting: Obstetrics & Gynecology

## 2022-09-04 ENCOUNTER — Other Ambulatory Visit (HOSPITAL_COMMUNITY)
Admission: RE | Admit: 2022-09-04 | Discharge: 2022-09-04 | Disposition: A | Discharge status: Home / Self Care | Payer: No Typology Code available for payment source | Source: Ambulatory Visit | Attending: Obstetrics & Gynecology | Admitting: Obstetrics & Gynecology

## 2022-09-04 VITALS — BP 121/78 | HR 76 | Ht 64.0 in | Wt 164.0 lb

## 2022-09-04 DIAGNOSIS — N941 Unspecified dyspareunia: Secondary | ICD-10-CM

## 2022-09-04 DIAGNOSIS — R102 Pelvic and perineal pain: Secondary | ICD-10-CM

## 2022-09-04 DIAGNOSIS — Z01419 Encounter for gynecological examination (general) (routine) without abnormal findings: Secondary | ICD-10-CM | POA: Insufficient documentation

## 2022-09-04 NOTE — Progress Notes (Signed)
Subjective:     Megan Hammond is a 26 y.o. female here for a routine exam.  Current complaints: continued left sided pain and pain with intercourse.    Gynecologic History Patient's last menstrual period was 08/07/2022. Contraception: coitus interruptus Last Pap: several years ago. Results were: normal Last mammogram: n/a.   Obstetric History OB History  Gravida Para Term Preterm AB Living  1 1 1     1   SAB IAB Ectopic Multiple Live Births          1    # Outcome Date GA Lbr Len/2nd Weight Sex Delivery Anes PTL Lv  1 Term 02/11/14 [redacted]w[redacted]d 12:51 6 lb 8.6 oz (2.965 kg) M Vag-Vacuum EPI  LIV     The following portions of the patient's history were reviewed and updated as appropriate: allergies, current medications, past family history, past medical history, past social history, past surgical history, and problem list.  Review of Systems Pertinent items noted in HPI and remainder of comprehensive ROS otherwise negative.    Objective:     Vitals:   09/04/22 1554  BP: 121/78  Pulse: 76  Weight: 164 lb (74.4 kg)  Height: 5\' 4"  (1.626 m)   Vitals:  WNL General appearance: alert, cooperative and no distress  HEENT: Normocephalic, without obvious abnormality, atraumatic Eyes: negative Throat: lips, mucosa, and tongue normal; teeth and gums normal  Respiratory: Clear to auscultation bilaterally  CV: Regular rate and rhythm  Breasts:  Normal appearance, no masses or tenderness, no nipple retraction or dimpling  GI: Soft, non-tender; bowel sounds normal; no masses,  no organomegaly  GU: External Genitalia:  Tanner V, no lesion Urethra:  No prolapse   Vagina: Pink, normal rugae, no blood or discharge  Cervix: No CMT, no lesion  Uterus:  Normal size and contour, tender of left ovary on bimuanul  Adnexa: Normal, no masses, non tender  Musculoskeletal: No edema, redness or tenderness in the calves or thighs  Skin: No lesions or rash  Lymphatic: Axillary adenopathy: none      Psychiatric: Normal mood and behavior        Assessment:    Healthy female exam.  Left sided pelvic pain and pain with intercourse   Plan:   Pap with cotesting Reviewed 09/06/22 results.  ? Adenomyosis at fundus.  She is not having heavy menses.  Will get CT with pro and IV contrast If CT negative, will traet for presumed endometriosis with OCPs. Pt has no contraindications to OCPs.

## 2022-09-05 ENCOUNTER — Other Ambulatory Visit: Payer: No Typology Code available for payment source

## 2022-09-06 LAB — CYTOLOGY - PAP
Adequacy: ABSENT
Diagnosis: NEGATIVE
